# Patient Record
Sex: Female | Born: 1955 | Race: White | Hispanic: No | Marital: Married | Smoking: Never smoker
Health system: Southern US, Community
[De-identification: ages and names within clinical notes are randomized; demographics above are authoritative.]

## PROBLEM LIST (undated history)

## (undated) DIAGNOSIS — I499 Cardiac arrhythmia, unspecified: Secondary | ICD-10-CM

## (undated) DIAGNOSIS — M8580 Other specified disorders of bone density and structure, unspecified site: Secondary | ICD-10-CM

## (undated) DIAGNOSIS — C801 Malignant (primary) neoplasm, unspecified: Secondary | ICD-10-CM

## (undated) HISTORY — PX: POLYPECTOMY: SHX149

## (undated) HISTORY — DX: Other specified disorders of bone density and structure, unspecified site: M85.80

## (undated) HISTORY — DX: Malignant (primary) neoplasm, unspecified: C80.1

## (undated) HISTORY — PX: COLONOSCOPY: SHX174

---
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
## 2003-01-16 ENCOUNTER — Other Ambulatory Visit: Admission: RE | Admit: 2003-01-16 | Discharge: 2003-01-16 | Payer: Self-pay | Admitting: Gynecology

## 2003-07-06 HISTORY — PX: LUMBAR DISC SURGERY: SHX700

## 2004-07-23 ENCOUNTER — Ambulatory Visit (HOSPITAL_COMMUNITY): Admission: RE | Admit: 2004-07-23 | Discharge: 2004-07-24 | Payer: Self-pay | Admitting: Neurological Surgery

## 2005-01-25 ENCOUNTER — Other Ambulatory Visit: Admission: RE | Admit: 2005-01-25 | Discharge: 2005-01-25 | Payer: Self-pay | Admitting: Gynecology

## 2007-04-05 ENCOUNTER — Ambulatory Visit: Payer: Self-pay | Admitting: Gastroenterology

## 2007-04-12 ENCOUNTER — Ambulatory Visit: Payer: Self-pay | Admitting: Gastroenterology

## 2007-04-12 ENCOUNTER — Encounter (INDEPENDENT_AMBULATORY_CARE_PROVIDER_SITE_OTHER): Payer: Self-pay | Admitting: *Deleted

## 2007-04-12 ENCOUNTER — Encounter: Payer: Self-pay | Admitting: Gastroenterology

## 2009-03-26 ENCOUNTER — Other Ambulatory Visit: Admission: RE | Admit: 2009-03-26 | Discharge: 2009-03-26 | Payer: Self-pay | Admitting: Obstetrics and Gynecology

## 2009-03-26 ENCOUNTER — Encounter: Payer: Self-pay | Admitting: Women's Health

## 2009-03-26 ENCOUNTER — Ambulatory Visit: Payer: Self-pay | Admitting: Women's Health

## 2009-04-22 ENCOUNTER — Ambulatory Visit (HOSPITAL_COMMUNITY): Admission: RE | Admit: 2009-04-22 | Discharge: 2009-04-22 | Payer: Self-pay | Admitting: Gynecology

## 2009-04-25 ENCOUNTER — Ambulatory Visit: Payer: Self-pay | Admitting: Gastroenterology

## 2009-04-25 DIAGNOSIS — K3189 Other diseases of stomach and duodenum: Secondary | ICD-10-CM | POA: Insufficient documentation

## 2009-04-25 DIAGNOSIS — R1013 Epigastric pain: Secondary | ICD-10-CM

## 2009-04-28 LAB — CONVERTED CEMR LAB
ALT: 20 units/L (ref 0–35)
Albumin: 4.5 g/dL (ref 3.5–5.2)
Basophils Absolute: 0 10*3/uL (ref 0.0–0.1)
CO2: 28 meq/L (ref 19–32)
Calcium: 9.3 mg/dL (ref 8.4–10.5)
Chloride: 101 meq/L (ref 96–112)
Eosinophils Relative: 1.2 % (ref 0.0–5.0)
GFR calc non Af Amer: 79.92 mL/min (ref >60)
Glucose, Bld: 84 mg/dL (ref 70–99)
HCT: 36.7 % (ref 36.0–46.0)
Lymphocytes Relative: 32.8 % (ref 12.0–46.0)
Lymphs Abs: 1.5 10*3/uL (ref 0.7–4.0)
Monocytes Relative: 8.1 % (ref 3.0–12.0)
Neutrophils Relative %: 56.9 % (ref 43.0–77.0)
Platelets: 223 10*3/uL (ref 150.0–400.0)
Potassium: 3.9 meq/L (ref 3.5–5.1)
Sodium: 139 meq/L (ref 135–145)
TSH: 2.48 microintl units/mL (ref 0.35–5.50)
Total Protein: 7.4 g/dL (ref 6.0–8.3)
WBC: 4.7 10*3/uL (ref 4.5–10.5)

## 2009-05-01 ENCOUNTER — Ambulatory Visit: Payer: Self-pay | Admitting: Cardiovascular Disease

## 2009-05-06 ENCOUNTER — Telehealth: Payer: Self-pay | Admitting: Gastroenterology

## 2009-05-06 ENCOUNTER — Encounter: Payer: Self-pay | Admitting: Gastroenterology

## 2010-02-25 ENCOUNTER — Encounter: Admission: RE | Admit: 2010-02-25 | Discharge: 2010-02-25 | Payer: Self-pay | Admitting: Family Medicine

## 2010-05-06 ENCOUNTER — Ambulatory Visit (HOSPITAL_COMMUNITY): Admission: RE | Admit: 2010-05-06 | Discharge: 2010-05-06 | Payer: Self-pay | Admitting: Family Medicine

## 2010-05-06 ENCOUNTER — Ambulatory Visit: Payer: Self-pay | Admitting: Women's Health

## 2010-05-06 ENCOUNTER — Other Ambulatory Visit: Admission: RE | Admit: 2010-05-06 | Discharge: 2010-05-06 | Payer: Self-pay | Admitting: Obstetrics and Gynecology

## 2010-05-27 ENCOUNTER — Ambulatory Visit: Payer: Self-pay | Admitting: Women's Health

## 2010-07-25 ENCOUNTER — Encounter: Payer: Self-pay | Admitting: Family Medicine

## 2010-08-18 ENCOUNTER — Telehealth: Payer: Self-pay | Admitting: Gastroenterology

## 2010-08-26 NOTE — Progress Notes (Signed)
Summary: ? reschedule   Phone Note Call from Patient Call back at Work Phone [phone]   Caller: Patient Call For: Dr Christella Hartigan Reason for Call: Talk to Nurse Summary of Call: Patient wants to rescheduled an EGD that was scheduled in '10 at the time patient was having problems (Dyspepsia) does she need to see Dr Christella Hartigan in the office again or can she reschedule appt. Initial call taken by: Tawni Levy,  August 18, 2010 2:25 PM  Follow-up for Phone Call        Dr Christella Hartigan do you want to see the pt in the office first? Follow-up by: Chales Abrahams CMA Duncan Dull),  August 18, 2010 2:28 PM  Additional Follow-up for Phone Call Additional follow up Details #1::        needs ROV before scheduling any appt. Additional Follow-up by: Rachael Fee MD,  August 18, 2010 2:41 PM    Additional Follow-up for Phone Call Additional follow up Details #2::    pt aware rov scheduled Follow-up by: Chales Abrahams CMA Duncan Dull),  August 18, 2010 2:43 PM

## 2010-09-18 ENCOUNTER — Ambulatory Visit: Payer: Self-pay | Admitting: Gastroenterology

## 2010-11-20 NOTE — Op Note (Signed)
Kayla Jacobs, Kayla Jacobs                 ACCOUNT NO.:  [id number]   MEDICAL RECORD NO.:  [id number]          PATIENT TYPE:  OIB   LOCATION:  2855                         FACILITY:  [facility]   PHYSICIAN:  Stefani Dama, M.D.  DATE OF BIRTH:  Nov 25, 1955   DATE OF PROCEDURE:  07/23/2004  DATE OF DISCHARGE:                                 OPERATIVE REPORT   PREOPERATIVE DIAGNOSIS:  Herniated nucleus pulposus, L4-L5 left with left  lumbar radiculopathy.   POSTOPERATIVE DIAGNOSIS:  Herniated nucleus pulposus, L4-L5 left with left  lumbar radiculopathy.   PROCEDURE:  L4-L5 METRx microdiskectomy with operating microscope,  microdissection technique.   SURGEON:  Stefani Dama, M.D.   FIRST ASSISTANT:  Hilda Lias, M.D.   ANESTHESIA:  General endotracheal.   INDICATIONS:  [name] is a 55 year old individual who has had  significant back and left lower extremity pain. She is been treated in  excess of six months conservatively and had some limited relief initially  but has had persistent lumbar radiculopathy, aggravated by any sort of  activity.  After careful consideration of options, she has been advised  regarding surgical diskectomy at the L4-L5 level.   PROCEDURE:  The patient was brought to the operating room supine on the  stretcher. After smooth induction of general endotracheal anesthesia, she  was turned prone and the back was shaved, prepped with Hibiclens and then  alcohol and then draped in sterile fashion.  Using fluoroscopic guidance  then the left L4-L5 interspace was localized. A K-wire was passed to the  laminar arch of L4 and then incision was made over this area after  infiltrating the skin with 0.5% Marcaine mixed with 1% lidocaine with  epinephrine for a total volume of 7 cc. Once the needle was placed then, a  series of dilators was placed over the K-wire and this was dilated up to the  14 mm diameter.  A 14 mm x 4 cm deep endoscopic cannula was then fixed  to  the operating table with a clamp.  The inner cannulas were removed and the  area was then evaluated under the operating microscope.  The soft tissues on  the laminar arch of L4 and the interlaminar space were then divided with the  monopolar cautery.  A combination of curettes and rongeurs was then used to  remove some of the ligamentous material.  A high-speed air drill and a 3 mm  dissecting tool was used to remove the inferior margin lamina of L4 out to  the mesial wall of the facet.  The interlaminar space was then cleared and  the common dural tube was identified.  On the lateral aspect of the common  dural tube a laminotomy was carried out further creating a partial  facetectomy at L4-L5.  The common dural tube could then be retracted gently  medially.  Underlying this was noted to be a significant soft tissue mass  covered by some ligamentous material. This was incised in a vertical fashion  and underneath was identified several large fragments of disk material.  With these being  removed, the common dural tube and particularly the take  off of the L5 nerve root was well decompressed. Further palpation around  this area identified the disk material had come from an opening in the disk  space itself. This was directly entered by cutting back the ligament  somewhat with a 2 mm Kerrison punch. The disk space could be entered easily  and a combination of curettes and rongeurs was then used to evacuate disk  space of a significant quantity of markedly degenerated disk material.  The  space was irrigated copiously and it was rongeured and curetted the number  of times to release any loose disk fragments within the disk space that  might need to come out. Once this area was decompressed, hemostasis in the  soft tissues and particularly epidural veins was obtained with bipolar  cautery. Care was taken to make sure that the L5 nerve root and the common  dural tube was well decompressed.   No spinal fluid leakage was identified  during this procedure.  The microscope was then removed.  The endoscopic  cannula was then removed and then the lumbodorsal fascia was closed with 3-0  Vicryl in interrupted fashion, 3-0 Vicryl used in the subcuticular tissues.  Dermabond was placed on the skin. Blood loss for this procedure was  estimated at 20 cc.      HJE/MEDQ  D:  07/23/2004  T:  07/23/2004  Job:  [id number]

## 2011-07-18 ENCOUNTER — Other Ambulatory Visit: Payer: Self-pay | Admitting: Women's Health

## 2012-04-14 ENCOUNTER — Encounter: Payer: Self-pay | Admitting: Gastroenterology

## 2012-07-13 ENCOUNTER — Encounter: Payer: Self-pay | Admitting: Gastroenterology

## 2012-07-27 ENCOUNTER — Ambulatory Visit (AMBULATORY_SURGERY_CENTER): Payer: BC Managed Care – PPO | Admitting: *Deleted

## 2012-07-27 VITALS — Ht 66.0 in | Wt 110.4 lb

## 2012-07-27 DIAGNOSIS — Z1211 Encounter for screening for malignant neoplasm of colon: Secondary | ICD-10-CM

## 2012-07-27 MED ORDER — MOVIPREP 100 G PO SOLR: ORAL | Status: DC

## 2012-08-09 ENCOUNTER — Encounter: Payer: Self-pay | Admitting: Gastroenterology

## 2012-08-09 ENCOUNTER — Ambulatory Visit (AMBULATORY_SURGERY_CENTER): Payer: BC Managed Care – PPO | Admitting: Gastroenterology

## 2012-08-09 VITALS — BP 97/63 | HR 70 | Temp 98.0°F | Resp 26 | Ht 66.0 in | Wt 110.0 lb

## 2012-08-09 DIAGNOSIS — D126 Benign neoplasm of colon, unspecified: Secondary | ICD-10-CM

## 2012-08-09 DIAGNOSIS — Z1211 Encounter for screening for malignant neoplasm of colon: Secondary | ICD-10-CM

## 2012-08-09 DIAGNOSIS — Z86010 Personal history of colonic polyps: Secondary | ICD-10-CM

## 2012-08-09 MED ORDER — SODIUM CHLORIDE 0.9 % IV SOLN: 500.0000 mL | INTRAVENOUS | Status: DC

## 2012-08-09 NOTE — Patient Instructions (Addendum)

## 2012-08-09 NOTE — Op Note (Signed)
[facility] [address] [place], [address]   COLONOSCOPY PROCEDURE REPORT  PATIENT: Kayla Jacobs, Kayla Jacobs  MR#: [id number] BIRTHDATE: 25-Jul-1955 , 56  yrs. old GENDER: Female ENDOSCOPIST: Rachael Fee, MD PROCEDURE DATE:  08/09/2012 PROCEDURE:   Colonoscopy with snare polypectomy ASA CLASS:   Class II INDICATIONS: 3mm adenoma removed 2008. MEDICATIONS: Fentanyl 75 mcg IV, Versed 7 mg IV, and These medications were titrated to patient response per physician's verbal order  DESCRIPTION OF PROCEDURE:   After the risks benefits and alternatives of the procedure were thoroughly explained, informed consent was obtained.  A digital rectal exam revealed no abnormalities of the rectum.   The LB CF-H180AL [id number]  endoscope was introduced through the anus and advanced to the cecum, which was identified by both the appendix and ileocecal valve. No adverse events experienced.   The quality of the prep was good, using MoviPrep  The instrument was then slowly withdrawn as the colon was fully examined.  COLON FINDINGS: One small polyp was removed and sent to pathology. This was 3mm across, sessile, located in transverse colon, removed with cold snare, sent to pathology (jar 1).  The examination was otherwise normal.  Retroflexed views revealed no abnormalities. The time to cecum=6 minutes 30 seconds.  Withdrawal time=9 minutes 28 seconds.  The scope was withdrawn and the procedure completed. COMPLICATIONS: There were no complications.  ENDOSCOPIC IMPRESSION: One small polyp was removed and sent to pathology. The examination was otherwise normal.  RECOMMENDATIONS: If the polyp(s) removed today are proven to be adenomatous (pre-cancerous) polyps, you will need a repeat colonoscopy in 5 years.  Otherwise you should continue to follow colorectal cancer screening guidelines for "routine risk" patients with colonoscopy in 10 years.  You will receive a letter within 1-2 weeks  with the results of your biopsy as well as final recommendations.  Please call my office if you have not received a letter after 3 weeks.   eSigned:  Rachael Fee, MD 08/09/2012 10:25 AM   cc: Herb Grays, MD

## 2012-08-09 NOTE — Progress Notes (Signed)
Pt. Has not passed gas post procedure.  She is not distended and denies any discomfort.  Dr. Christella Hartigan advised.  No orders given.

## 2012-08-10 ENCOUNTER — Telehealth: Payer: Self-pay | Admitting: *Deleted

## 2012-08-10 NOTE — Telephone Encounter (Signed)
  Follow up Call-  Call back number 08/09/2012  Post procedure Call Back phone  # [phone]  Permission to leave phone message Yes     No answer, left message to call office if questions or concerns.

## 2012-08-16 ENCOUNTER — Encounter: Payer: Self-pay | Admitting: Gastroenterology

## 2014-09-13 ENCOUNTER — Other Ambulatory Visit: Payer: Self-pay | Admitting: Family Medicine

## 2014-09-13 ENCOUNTER — Other Ambulatory Visit (HOSPITAL_COMMUNITY)
Admission: RE | Admit: 2014-09-13 | Discharge: 2014-09-13 | Disposition: A | Discharge status: Home / Self Care | Payer: BLUE CROSS/BLUE SHIELD | Source: Ambulatory Visit | Attending: Family Medicine | Admitting: Family Medicine

## 2014-09-13 DIAGNOSIS — Z1151 Encounter for screening for human papillomavirus (HPV): Secondary | ICD-10-CM | POA: Insufficient documentation

## 2014-09-13 DIAGNOSIS — Z124 Encounter for screening for malignant neoplasm of cervix: Secondary | ICD-10-CM | POA: Insufficient documentation

## 2014-09-17 LAB — CYTOLOGY - PAP

## 2014-10-28 ENCOUNTER — Other Ambulatory Visit: Payer: Self-pay | Admitting: Family Medicine

## 2014-10-28 DIAGNOSIS — R9389 Abnormal findings on diagnostic imaging of other specified body structures: Secondary | ICD-10-CM

## 2015-03-25 ENCOUNTER — Encounter: Payer: Self-pay | Admitting: Gastroenterology

## 2016-01-01 DIAGNOSIS — H11042 Peripheral pterygium, stationary, left eye: Secondary | ICD-10-CM | POA: Diagnosis not present

## 2016-01-01 DIAGNOSIS — H2513 Age-related nuclear cataract, bilateral: Secondary | ICD-10-CM | POA: Diagnosis not present

## 2016-02-16 DIAGNOSIS — M7989 Other specified soft tissue disorders: Secondary | ICD-10-CM | POA: Diagnosis not present

## 2016-04-27 DIAGNOSIS — C44712 Basal cell carcinoma of skin of right lower limb, including hip: Secondary | ICD-10-CM | POA: Diagnosis not present

## 2016-04-27 DIAGNOSIS — Z85828 Personal history of other malignant neoplasm of skin: Secondary | ICD-10-CM | POA: Diagnosis not present

## 2016-04-27 DIAGNOSIS — Z08 Encounter for follow-up examination after completed treatment for malignant neoplasm: Secondary | ICD-10-CM | POA: Diagnosis not present

## 2016-04-27 DIAGNOSIS — D485 Neoplasm of uncertain behavior of skin: Secondary | ICD-10-CM | POA: Diagnosis not present

## 2016-09-18 DIAGNOSIS — J019 Acute sinusitis, unspecified: Secondary | ICD-10-CM | POA: Diagnosis not present

## 2016-11-08 DIAGNOSIS — J209 Acute bronchitis, unspecified: Secondary | ICD-10-CM | POA: Diagnosis not present

## 2017-01-06 DIAGNOSIS — H524 Presbyopia: Secondary | ICD-10-CM | POA: Diagnosis not present

## 2017-01-06 DIAGNOSIS — H5203 Hypermetropia, bilateral: Secondary | ICD-10-CM | POA: Diagnosis not present

## 2017-01-11 DIAGNOSIS — Z1231 Encounter for screening mammogram for malignant neoplasm of breast: Secondary | ICD-10-CM | POA: Diagnosis not present

## 2017-01-11 DIAGNOSIS — M8589 Other specified disorders of bone density and structure, multiple sites: Secondary | ICD-10-CM | POA: Diagnosis not present

## 2017-02-08 DIAGNOSIS — R1013 Epigastric pain: Secondary | ICD-10-CM | POA: Diagnosis not present

## 2017-02-08 DIAGNOSIS — M8580 Other specified disorders of bone density and structure, unspecified site: Secondary | ICD-10-CM | POA: Diagnosis not present

## 2017-02-08 DIAGNOSIS — Z0000 Encounter for general adult medical examination without abnormal findings: Secondary | ICD-10-CM | POA: Diagnosis not present

## 2017-02-09 ENCOUNTER — Other Ambulatory Visit: Payer: Self-pay | Admitting: Family Medicine

## 2017-02-09 DIAGNOSIS — R1013 Epigastric pain: Secondary | ICD-10-CM

## 2017-03-03 ENCOUNTER — Ambulatory Visit
Admission: RE | Admit: 2017-03-03 | Discharge: 2017-03-03 | Disposition: A | Discharge status: Home / Self Care | Payer: BLUE CROSS/BLUE SHIELD | Source: Ambulatory Visit | Attending: Family Medicine | Admitting: Family Medicine

## 2017-03-03 DIAGNOSIS — R1013 Epigastric pain: Secondary | ICD-10-CM

## 2017-03-03 DIAGNOSIS — K7689 Other specified diseases of liver: Secondary | ICD-10-CM | POA: Diagnosis not present

## 2017-03-03 MED ORDER — IOPAMIDOL (ISOVUE-300) INJECTION 61%
100.0000 mL | Freq: Once | INTRAVENOUS | Status: AC | PRN
  Administered 2017-03-03: 100 mL via INTRAVENOUS

## 2017-03-10 ENCOUNTER — Other Ambulatory Visit: Payer: Self-pay | Admitting: Family Medicine

## 2017-03-10 DIAGNOSIS — R160 Hepatomegaly, not elsewhere classified: Secondary | ICD-10-CM

## 2017-03-20 ENCOUNTER — Other Ambulatory Visit: Payer: BLUE CROSS/BLUE SHIELD

## 2017-03-31 ENCOUNTER — Ambulatory Visit
Admission: RE | Admit: 2017-03-31 | Discharge: 2017-03-31 | Disposition: A | Discharge status: Home / Self Care | Payer: BLUE CROSS/BLUE SHIELD | Source: Ambulatory Visit | Attending: Family Medicine | Admitting: Family Medicine

## 2017-03-31 DIAGNOSIS — D1803 Hemangioma of intra-abdominal structures: Secondary | ICD-10-CM | POA: Diagnosis not present

## 2017-03-31 DIAGNOSIS — R160 Hepatomegaly, not elsewhere classified: Secondary | ICD-10-CM

## 2017-03-31 MED ORDER — GADOBENATE DIMEGLUMINE 529 MG/ML IV SOLN
10.0000 mL | Freq: Once | INTRAVENOUS | Status: AC | PRN
  Administered 2017-03-31: 10 mL via INTRAVENOUS

## 2017-04-26 DIAGNOSIS — Z08 Encounter for follow-up examination after completed treatment for malignant neoplasm: Secondary | ICD-10-CM | POA: Diagnosis not present

## 2017-04-26 DIAGNOSIS — Z85828 Personal history of other malignant neoplasm of skin: Secondary | ICD-10-CM | POA: Diagnosis not present

## 2017-08-09 ENCOUNTER — Encounter: Payer: Self-pay | Admitting: Gastroenterology

## 2017-08-11 DIAGNOSIS — J069 Acute upper respiratory infection, unspecified: Secondary | ICD-10-CM | POA: Diagnosis not present

## 2017-08-11 DIAGNOSIS — R0981 Nasal congestion: Secondary | ICD-10-CM | POA: Diagnosis not present

## 2017-08-19 ENCOUNTER — Encounter: Payer: Self-pay | Admitting: Gastroenterology

## 2017-09-27 DIAGNOSIS — Z23 Encounter for immunization: Secondary | ICD-10-CM | POA: Diagnosis not present

## 2017-10-11 ENCOUNTER — Encounter: Payer: BLUE CROSS/BLUE SHIELD | Admitting: Gastroenterology

## 2017-10-25 ENCOUNTER — Encounter: Payer: Self-pay | Admitting: Gastroenterology

## 2017-10-25 ENCOUNTER — Ambulatory Visit (AMBULATORY_SURGERY_CENTER): Payer: Self-pay | Admitting: *Deleted

## 2017-10-25 ENCOUNTER — Other Ambulatory Visit: Payer: Self-pay

## 2017-10-25 VITALS — Ht 65.56 in | Wt 107.0 lb

## 2017-10-25 DIAGNOSIS — Z860100 Personal history of colon polyps, unspecified: Secondary | ICD-10-CM

## 2017-10-25 MED ORDER — PEG 3350-KCL-NA BICARB-NACL 420 G PO SOLR: 4000.0000 mL | Freq: Once | ORAL | 0 refills | Status: AC

## 2017-10-25 NOTE — Progress Notes (Signed)
No egg or soy allergy known to patient  No issues with past sedation with any surgeries  or procedures, no intubation problems  No diet pills per patient No home 02 use per patient  No blood thinners per patient  Pt denies issues with constipation  No A fib or A flutter  EMMI video sent to pt's e mail pt declined   

## 2017-11-08 ENCOUNTER — Other Ambulatory Visit: Payer: Self-pay

## 2017-11-08 ENCOUNTER — Encounter: Payer: Self-pay | Admitting: Gastroenterology

## 2017-11-08 ENCOUNTER — Ambulatory Visit (AMBULATORY_SURGERY_CENTER): Payer: BLUE CROSS/BLUE SHIELD | Admitting: Gastroenterology

## 2017-11-08 VITALS — BP 110/57 | HR 70 | Temp 98.2°F | Resp 16 | Ht 66.0 in | Wt 108.0 lb

## 2017-11-08 DIAGNOSIS — Z86010 Personal history of colonic polyps: Secondary | ICD-10-CM

## 2017-11-08 DIAGNOSIS — Z1211 Encounter for screening for malignant neoplasm of colon: Secondary | ICD-10-CM | POA: Diagnosis not present

## 2017-11-08 MED ORDER — SODIUM CHLORIDE 0.9 % IV SOLN: 500.0000 mL | Freq: Once | INTRAVENOUS | Status: DC

## 2017-11-08 NOTE — Progress Notes (Signed)
To PACU, VSS. Report to RN.tb 

## 2017-11-08 NOTE — Progress Notes (Signed)
Pt's states no medical or surgical changes since previsit or office visit. 

## 2017-11-08 NOTE — Patient Instructions (Signed)

## 2017-11-08 NOTE — Op Note (Signed)
[place] [facility] Patient Name: Kayla Jacobs Procedure Date: 11/08/2017 1:49 PM MRN: [id number] Endoscopist: Milus Banister , MD Age: 62 Referring MD:  Date of Birth: 08/15/1955 Gender: Female Account #: [id number] Procedure:                Colonoscopy Indications:              High risk colon cancer surveillance: Personal                            history of colonic polyps; colonoscopy 2008 single                            subCM adenoma, colonoscopy 2014 single subCM adenoma Medicines:                Monitored Anesthesia Care Procedure:                Pre-Anesthesia Assessment:                           - Prior to the procedure, a History and Physical                            was performed, and patient medications and                            allergies were reviewed. The patient's tolerance of                            previous anesthesia was also reviewed. The risks                            and benefits of the procedure and the sedation                            options and risks were discussed with the patient.                            All questions were answered, and informed consent                            was obtained. Prior Anticoagulants: The patient has                            taken no previous anticoagulant or antiplatelet                            agents. ASA Grade Assessment: II - A patient with                            mild systemic disease. After reviewing the risks                            and benefits, the patient was deemed in  satisfactory condition to undergo the procedure.                           After obtaining informed consent, the colonoscope                            was passed under direct vision. Throughout the                            procedure, the patient's blood pressure, pulse, and                            oxygen saturations were monitored continuously. The                            Colonoscope  was introduced through the anus and                            advanced to the the cecum, identified by                            appendiceal orifice and ileocecal valve. The                            colonoscopy was performed without difficulty. The                            patient tolerated the procedure well. The quality                            of the bowel preparation was good. The ileocecal                            valve, appendiceal orifice, and rectum were                            photographed. Scope In: 1:57:34 PM Scope Out: 2:14:42 PM Scope Withdrawal Time: 0 hours 6 minutes 49 seconds  Total Procedure Duration: 0 hours 17 minutes 8 seconds  Findings:                 The entire examined colon appeared normal on direct                            and retroflexion views. Complications:            No immediate complications. Estimated blood loss:                            None. Estimated Blood Loss:     Estimated blood loss: none. Impression:               - The entire examined colon is normal on direct and                            retroflexion views.                           -  No polyps or cancers. Recommendation:           - Patient has a contact number available for                            emergencies. The signs and symptoms of potential                            delayed complications were discussed with the                            patient. Return to normal activities tomorrow.                            Written discharge instructions were provided to the                            patient.                           - Resume previous diet.                           - Continue present medications.                           - Repeat colonoscopy in 10 years for screening                            purposes. Milus Banister, MD 11/08/2017 2:17:19 PM This report has been signed electronically.

## 2017-11-09 ENCOUNTER — Telehealth: Payer: Self-pay | Admitting: *Deleted

## 2017-11-09 ENCOUNTER — Telehealth: Payer: Self-pay

## 2017-11-09 NOTE — Telephone Encounter (Signed)
Left message

## 2017-11-09 NOTE — Telephone Encounter (Signed)
Left message on voicemail.

## 2018-01-17 DIAGNOSIS — H524 Presbyopia: Secondary | ICD-10-CM | POA: Diagnosis not present

## 2018-01-18 DIAGNOSIS — C44712 Basal cell carcinoma of skin of right lower limb, including hip: Secondary | ICD-10-CM | POA: Diagnosis not present

## 2018-01-18 DIAGNOSIS — D485 Neoplasm of uncertain behavior of skin: Secondary | ICD-10-CM | POA: Diagnosis not present

## 2018-02-09 DIAGNOSIS — Z1231 Encounter for screening mammogram for malignant neoplasm of breast: Secondary | ICD-10-CM | POA: Diagnosis not present

## 2018-03-20 DIAGNOSIS — C44712 Basal cell carcinoma of skin of right lower limb, including hip: Secondary | ICD-10-CM | POA: Diagnosis not present

## 2018-04-15 DIAGNOSIS — J019 Acute sinusitis, unspecified: Secondary | ICD-10-CM | POA: Diagnosis not present

## 2018-06-09 DIAGNOSIS — J0100 Acute maxillary sinusitis, unspecified: Secondary | ICD-10-CM | POA: Diagnosis not present

## 2018-06-12 DIAGNOSIS — J019 Acute sinusitis, unspecified: Secondary | ICD-10-CM | POA: Diagnosis not present

## 2018-08-07 DIAGNOSIS — C44712 Basal cell carcinoma of skin of right lower limb, including hip: Secondary | ICD-10-CM | POA: Diagnosis not present

## 2018-08-25 ENCOUNTER — Observation Stay (HOSPITAL_COMMUNITY)
Admission: EM | Admit: 2018-08-25 | Discharge: 2018-08-26 | Disposition: A | Discharge status: Home / Self Care | Payer: BC Managed Care – PPO | Attending: Interventional Cardiology | Admitting: Interventional Cardiology

## 2018-08-25 ENCOUNTER — Encounter (HOSPITAL_COMMUNITY): Payer: Self-pay | Admitting: Emergency Medicine

## 2018-08-25 ENCOUNTER — Other Ambulatory Visit: Payer: Self-pay

## 2018-08-25 ENCOUNTER — Emergency Department (HOSPITAL_COMMUNITY): Payer: BC Managed Care – PPO

## 2018-08-25 DIAGNOSIS — Z85828 Personal history of other malignant neoplasm of skin: Secondary | ICD-10-CM | POA: Insufficient documentation

## 2018-08-25 DIAGNOSIS — Z7901 Long term (current) use of anticoagulants: Secondary | ICD-10-CM | POA: Insufficient documentation

## 2018-08-25 DIAGNOSIS — I4510 Unspecified right bundle-branch block: Secondary | ICD-10-CM | POA: Insufficient documentation

## 2018-08-25 DIAGNOSIS — I071 Rheumatic tricuspid insufficiency: Secondary | ICD-10-CM | POA: Insufficient documentation

## 2018-08-25 DIAGNOSIS — R002 Palpitations: Secondary | ICD-10-CM | POA: Diagnosis not present

## 2018-08-25 DIAGNOSIS — I4892 Unspecified atrial flutter: Secondary | ICD-10-CM | POA: Diagnosis present

## 2018-08-25 DIAGNOSIS — I341 Nonrheumatic mitral (valve) prolapse: Secondary | ICD-10-CM | POA: Insufficient documentation

## 2018-08-25 DIAGNOSIS — Z79899 Other long term (current) drug therapy: Secondary | ICD-10-CM | POA: Diagnosis not present

## 2018-08-25 DIAGNOSIS — I700 Atherosclerosis of aorta: Secondary | ICD-10-CM | POA: Insufficient documentation

## 2018-08-25 DIAGNOSIS — I483 Typical atrial flutter: Secondary | ICD-10-CM | POA: Diagnosis not present

## 2018-08-25 DIAGNOSIS — M8580 Other specified disorders of bone density and structure, unspecified site: Secondary | ICD-10-CM | POA: Insufficient documentation

## 2018-08-25 LAB — COMPREHENSIVE METABOLIC PANEL
ALBUMIN: 4.4 g/dL (ref 3.5–5.0)
ALT: 24 U/L (ref 0–44)
AST: 33 U/L (ref 15–41)
Alkaline Phosphatase: 37 U/L — ABNORMAL LOW (ref 38–126)
Anion gap: 10 (ref 5–15)
BUN: 12 mg/dL (ref 8–23)
CHLORIDE: 106 mmol/L (ref 98–111)
CO2: 24 mmol/L (ref 22–32)
CREATININE: 1.07 mg/dL — AB (ref 0.44–1.00)
Calcium: 9.9 mg/dL (ref 8.9–10.3)
GFR calc Af Amer: >60 mL/min (ref >60)
GFR calc non Af Amer: 56 mL/min — ABNORMAL LOW (ref >60)
Glucose, Bld: 98 mg/dL (ref 70–99)
POTASSIUM: 4 mmol/L (ref 3.5–5.1)
SODIUM: 140 mmol/L (ref 135–145)
Total Bilirubin: 1.3 mg/dL — ABNORMAL HIGH (ref 0.3–1.2)
Total Protein: 6.8 g/dL (ref 6.5–8.1)

## 2018-08-25 LAB — CBC WITH DIFFERENTIAL/PLATELET
ABS IMMATURE GRANULOCYTES: 0.01 10*3/uL (ref 0.00–0.07)
BASOS ABS: 0 10*3/uL (ref 0.0–0.1)
BASOS PCT: 1 %
Eosinophils Absolute: 0 10*3/uL (ref 0.0–0.5)
Eosinophils Relative: 0 %
HCT: 42.5 % (ref 36.0–46.0)
Hemoglobin: 13.7 g/dL (ref 12.0–15.0)
IMMATURE GRANULOCYTES: 0 %
Lymphocytes Relative: 28 %
Lymphs Abs: 1.3 10*3/uL (ref 0.7–4.0)
MCH: 32 pg (ref 26.0–34.0)
MCHC: 32.2 g/dL (ref 30.0–36.0)
MCV: 99.3 fL (ref 80.0–100.0)
MONOS PCT: 11 %
Monocytes Absolute: 0.5 10*3/uL (ref 0.1–1.0)
NEUTROS ABS: 2.7 10*3/uL (ref 1.7–7.7)
NEUTROS PCT: 60 %
NRBC: 0 % (ref 0.0–0.2)
PLATELETS: 177 10*3/uL (ref 150–400)
RBC: 4.28 MIL/uL (ref 3.87–5.11)
RDW: 11.9 % (ref 11.5–15.5)
WBC: 4.6 10*3/uL (ref 4.0–10.5)

## 2018-08-25 LAB — TROPONIN I

## 2018-08-25 LAB — TSH: TSH: 4.332 u[IU]/mL (ref 0.350–4.500)

## 2018-08-25 LAB — MAGNESIUM: Magnesium: 1.7 mg/dL (ref 1.7–2.4)

## 2018-08-25 MED ORDER — DILTIAZEM HCL-DEXTROSE 100-5 MG/100ML-% IV SOLN (PREMIX)
5.0000 mg/h | INTRAVENOUS | Status: DC
  Administered 2018-08-25: 5 mg/h via INTRAVENOUS
  Filled 2018-08-25: qty 100

## 2018-08-25 MED ORDER — ONDANSETRON HCL 4 MG/2ML IJ SOLN: 4.0000 mg | Freq: Four times a day (QID) | INTRAMUSCULAR | Status: DC | PRN

## 2018-08-25 MED ORDER — DILTIAZEM HCL 25 MG/5ML IV SOLN
15.0000 mg | Freq: Once | INTRAVENOUS | Status: AC
  Administered 2018-08-25: 15 mg via INTRAVENOUS
  Filled 2018-08-25: qty 5

## 2018-08-25 MED ORDER — DILTIAZEM HCL 25 MG/5ML IV SOLN
20.0000 mg | Freq: Once | INTRAVENOUS | Status: AC
  Administered 2018-08-25: 20 mg via INTRAVENOUS
  Filled 2018-08-25: qty 5

## 2018-08-25 MED ORDER — ACETAMINOPHEN 325 MG PO TABS: 650.0000 mg | ORAL_TABLET | ORAL | Status: DC | PRN

## 2018-08-25 MED ORDER — DILTIAZEM HCL 30 MG PO TABS
30.0000 mg | ORAL_TABLET | Freq: Four times a day (QID) | ORAL | Status: DC
  Administered 2018-08-25 – 2018-08-26 (×3): 30 mg via ORAL
  Filled 2018-08-25 (×3): qty 1

## 2018-08-25 MED ORDER — APIXABAN 5 MG PO TABS
5.0000 mg | ORAL_TABLET | Freq: Two times a day (BID) | ORAL | Status: DC
  Administered 2018-08-25 – 2018-08-26 (×2): 5 mg via ORAL
  Filled 2018-08-25 (×2): qty 1

## 2018-08-25 NOTE — ED Provider Notes (Signed)
[facility] EMERGENCY DEPARTMENT Provider Note   CSN: [id number] Arrival date & time: 08/25/18  1416    History   Chief Complaint No chief complaint on file.   HPI Kayla Jacobs is a 63 y.o. female.     HPI 63 year old female presents the emergency department with complaints of palpitations since Sunday.  No prior history of A. fib or a flutter.  When saw her primary care doctor today was found to have atrial flutter pleuritic.  She is continued to be able to work out through the week.  No new medications.  1 cup of coffee a day.  No other dietary supplements.  No other complaints at this time.  No chest pain or chest tightness.  No syncope.   Past Medical History:  Diagnosis Date  . Cancer ([place])    basal cell skin cancer  . Osteopenia     Patient Active Problem List   Diagnosis Date Noted  . DYSPEPSIA&OTHER [facility] DISORDERS FUNCTION STOMACH 04/25/2009    Past Surgical History:  Procedure Laterality Date  . CESAREAN SECTION  1988  . COLONOSCOPY    . [place] SURGERY  2005  . POLYPECTOMY    . TONSILLECTOMY  1962     OB History   No obstetric history on file.      Home Medications    Prior to Admission medications   Medication Sig Start Date End Date Taking? Authorizing Provider  Cholecalciferol (VITAMIN D3) 1000 units CHEW Chew 2,000 Units by mouth daily. gummies    [provider]  Multiple Vitamin (MULTIVITAMIN) tablet Take 1 tablet by mouth daily. Vita fusion Gummies for Women    [provider]  vitamin C (ASCORBIC ACID) 500 MG tablet Take 1,000 mg by mouth daily.    [provider]    Family History Family History  Problem Relation Age of Onset  . Esophageal cancer Father   . Colon cancer Neg Hx   . Stomach cancer Neg Hx   . Colon polyps Neg Hx   . Rectal cancer Neg Hx     Social History Social History   Tobacco Use  . Smoking status: Never Smoker  . Smokeless tobacco: Never Used  Substance Use  Topics  . Alcohol use: Yes    Alcohol/week: 7.0 standard drinks    Types: 7 Glasses of wine per week  . Drug use: No     Allergies   Patient has no known allergies.   Review of Systems Review of Systems  All other systems reviewed and are negative.    Physical Exam Updated Vital Signs BP 106/65   Pulse (!) 45   Resp 13   Ht 5\' 6"  (1.676 m)   Wt 49.9 kg   SpO2 100%   BMI 17.75 kg/m   Physical Exam Vitals signs and nursing note reviewed.  Constitutional:      General: She is not in acute distress.    Appearance: She is well-developed.  HENT:     Head: Normocephalic and atraumatic.  Neck:     Musculoskeletal: Normal range of motion.  Cardiovascular:     Rate and Rhythm: Tachycardia present.     Heart sounds: Normal heart sounds.  Pulmonary:     Effort: Pulmonary effort is normal.     Breath sounds: Normal breath sounds.  Abdominal:     General: There is no distension.     Palpations: Abdomen is soft.     Tenderness: There is  no abdominal tenderness.  Musculoskeletal: Normal range of motion.  Skin:    General: Skin is warm and dry.  Neurological:     Mental Status: She is alert and oriented to person, place, and time.  Psychiatric:        Judgment: Judgment normal.      ED Treatments / Results  Labs (all labs ordered are listed, but only abnormal results are displayed) Labs Reviewed  COMPREHENSIVE METABOLIC PANEL - Abnormal; Notable for the following components:      Result Value   Creatinine, Ser 1.07 (*)    Alkaline Phosphatase 37 (*)    Total Bilirubin 1.3 (*)    GFR calc non Af Amer 56 (*)    All other components within normal limits  CBC WITH DIFFERENTIAL/PLATELET  TROPONIN I  TSH  MAGNESIUM    EKG EKG Interpretation  Date/Time:  Friday August 25 2018 14:35:20 EST Ventricular Rate:  143 PR Interval:    QRS Duration: 144 QT Interval:  322 QTC Calculation: 497 R Axis:   107 Text Interpretation:  Atrial flutter with predominant 2:1  AV block Right bundle branch block Probable lateral infarct, old No old tracing to compare Confirmed by Jola Schmidt [phone]) on 08/25/2018 4:03:31 PM   Radiology Dg Chest Portable 1 View  Result Date: 08/25/2018 CLINICAL DATA:  Chest palpitations today. EXAM: PORTABLE CHEST 1 VIEW COMPARISON:  None. FINDINGS: The lungs are clear. Heart size is normal. Aortic atherosclerosis is noted. No pneumothorax or pleural effusion. No acute or focal bony abnormality IMPRESSION: No acute disease. Atherosclerosis. Electronically Signed   By: Inge Rise M.D.   On: 08/25/2018 15:05    Procedures Procedures (including critical care time)  Medications Ordered in ED Medications  diltiazem (CARDIZEM) 100 mg in dextrose 5% 144mL (1 mg/mL) infusion (5 mg/hr Intravenous New Bag/Given 08/25/18 1512)  diltiazem (CARDIZEM) injection 15 mg (has no administration in time range)  diltiazem (CARDIZEM) injection 20 mg (20 mg Intravenous Given 08/25/18 1509)     Initial Impression / Assessment and Plan / ED Course  I have reviewed the triage vital signs and the nursing notes.  Pertinent labs & imaging results that were available during my care of the patient were reviewed by me and considered in my medical decision making (see chart for details).        Symptomatic atrial flutter.  Attempting rate control here with Cardizem.  Cardiology consultation.    Final Clinical Impressions(s) / ED Diagnoses   Final diagnoses:  Typical atrial flutter [facility])    ED Discharge Orders    None       Jola Schmidt, MD 08/25/18 1651

## 2018-08-25 NOTE — H&P (Addendum)
The patient has been seen in conjunction with Vin Bhagat, PAC. All aspects of care have been considered and discussed. The patient has been personally interviewed, examined, and all clinical data has been reviewed.   Newly diagnosed atrial flutter, typical.  The patient believes this particular episode is been going on for at least 5 days.  She has had at least 5-6 other episodes, which almost always seem to occur with curling.  She came to the emergency room because this particular episode has lasted so long and also because the schools were closed due to a snow day.  Despite being out of rhythm for the last 5 days she has been able to work, exercise, and to do relatively well without symptoms other than palpitations that make it difficult for her to sleep at night and weakness which is present first thing in the morning with associated dizziness.  Drinks wine daily.  No history of diabetes, stroke, hypertension, vascular disease, sleep apnea, or difficulty with snoring.  Chads Vasc is 1.  Plan IV diltiazem for rate control.  We will simultaneously start immediate release oral diltiazem, which will allow the IV diltiazem to be discontinued if heart rate drops below 60 bpm.  We will start apixaban 5 mg twice daily.  We will hope for spontaneous conversion overnight.  She needs a 2D Doppler echocardiogram.  Depending on how she tolerates rate slowing (in terms of blood pressure and overall energy) could likely be discharged with outpatient follow-up.  Could be a candidate for outpatient electrical cardioversion or ablation since she does not want to be on medication chronically.   Cardiology Consultation:   Patient ID: Kayla Jacobs MRN: [id number]; DOB: [birth year]  Admit date: 08/25/2018 Date of Consult: 08/25/2018  Primary Care Provider: Stephani Police, MD Primary Cardiologist: New to [place] (Dr. Tamala Julian)   Patient Profile:   Kayla Jacobs is a 63 y.o. female with no significant  PMH who is being seen today for the evaluation of atrial flutter at rapid ventricular rate at the request of Dr. Venora Maples.  History of Present Illness:   Kayla Jacobs has history of palpitation for past 2 years.  Each episode lasting for day or 2.  She has constant palpitation since Sunday. This is her 5th episode but longest.  Due to ongoing palpitation, she went to see her PCP today and noted atrial flutter at rapid ventricular rate and sent to ER for further evaluation.  At baseline, patient is very active. She walks 4 miles every day and does lifting every other day.  Due to her active lifestyle she usually does not feel any other symptoms.  However this episode of her palpitation is longest with intermittent associated shortness of breath.  Drinks 1 cup of coffee every day.  No soda.  Drinks 1 glass of wine each night.  Denies illicit drug use or work with smoking.  No family history of CAD, stroke or arrhythmia.  Denies chest pain, orthopnea, PND, syncope, lower extremity edema or melena.  Patient started on IV Cardizem and now heart rate improved to 90-100.  Past Medical History:  Diagnosis Date  . Cancer ([place])    basal cell skin cancer  . Osteopenia     Past Surgical History:  Procedure Laterality Date  . CESAREAN SECTION  1988  . COLONOSCOPY    . [place] SURGERY  2005  . POLYPECTOMY    . TONSILLECTOMY  1962    Inpatient Medications: Scheduled Meds:  Continuous  Infusions: . sodium chloride    . diltiazem (CARDIZEM) infusion 5 mg/hr (08/25/18 1512)   PRN Meds:   Allergies:   No Known Allergies  Social History:   Social History   Socioeconomic History  . Marital status: Married    Spouse name: Not on file  . Number of children: Not on file  . Years of education: Not on file  . Highest education level: Not on file  Occupational History  . Not on file  Social Needs  . Financial resource strain: Not on file  . Food insecurity:    Worry: Not on file    Inability:  Not on file  . Transportation needs:    Medical: Not on file    Non-medical: Not on file  Tobacco Use  . Smoking status: Never Smoker  . Smokeless tobacco: Never Used  Substance and Sexual Activity  . Alcohol use: Yes    Alcohol/week: 7.0 standard drinks    Types: 7 Glasses of wine per week  . Drug use: No  . Sexual activity: Not on file  Lifestyle  . Physical activity:    Days per week: Not on file    Minutes per session: Not on file  . Stress: Not on file  Relationships  . Social connections:    Talks on phone: Not on file    Gets together: Not on file    Attends religious service: Not on file    Active member of club or organization: Not on file    Attends meetings of clubs or organizations: Not on file    Relationship status: Not on file  . Intimate partner violence:    Fear of current or ex partner: Not on file    Emotionally abused: Not on file    Physically abused: Not on file    Forced sexual activity: Not on file  Other Topics Concern  . Not on file  Social History Narrative  . Not on file    Family History:   Family History  Problem Relation Age of Onset  . Esophageal cancer Father   . Colon cancer Neg Hx   . Stomach cancer Neg Hx   . Colon polyps Neg Hx   . Rectal cancer Neg Hx      ROS:  Please see the history of present illness.  All other ROS reviewed and negative.     Physical Exam/Data:   Vitals:   08/25/18 1623 08/25/18 1645 08/25/18 1700 08/25/18 1715  BP:  116/72 104/74   Pulse: (!) 45 63 73 74  Resp: 13 15 14 15   SpO2: 100% 100% 100% 100%  Weight:      Height:       No intake or output data in the 24 hours ending 08/25/18 1738 Last 3 Weights 08/25/2018 11/08/2017 10/25/2017  Weight (lbs) 110 lb 108 lb 107 lb  Weight (kg) 49.896 kg 48.988 kg 48.535 kg     Body mass index is 17.75 kg/m.  General:  Well nourished, well developed, in no acute distres HEENT: normal Lymph: no adenopathy Neck: no JVD Endocrine:  No  thryomegaly Vascular: No carotid bruits; FA pulses 2+ bilaterally without bruits  Cardiac:  normal S1, S2; regular tachycardic; no murmur  Lungs:  clear to auscultation bilaterally, no wheezing, rhonchi or rales  Abd: soft, nontender, no hepatomegaly  Ext: no edema Musculoskeletal:  No deformities, BUE and BLE strength normal and equal Skin: warm and dry  Neuro:  CNs 2-12 intact, no  focal abnormalities noted Psych:  Normal affect   EKG:  The EKG was personally reviewed and demonstrates: Atrial flutter at rate of 143 bpm with 2-1 conduction   Relevant CV Studies: As summarized above  Laboratory Data:  Chemistry Recent Labs  Lab 08/25/18 1457  NA 140  K 4.0  CL 106  CO2 24  GLUCOSE 98  BUN 12  CREATININE 1.07*  CALCIUM 9.9  GFRNONAA 56*  GFRAA >60  ANIONGAP 10    Recent Labs  Lab 08/25/18 1457  PROT 6.8  ALBUMIN 4.4  AST 33  ALT 24  ALKPHOS 37*  BILITOT 1.3*   Hematology Recent Labs  Lab 08/25/18 1457  WBC 4.6  RBC 4.28  HGB 13.7  HCT 42.5  MCV 99.3  MCH 32.0  MCHC 32.2  RDW 11.9  PLT 177   Cardiac Enzymes Recent Labs  Lab 08/25/18 1457  TROPONINI <0.03   Radiology/Studies:  Dg Chest Portable 1 View  Result Date: 08/25/2018 CLINICAL DATA:  Chest palpitations today. EXAM: PORTABLE CHEST 1 VIEW COMPARISON:  None. FINDINGS: The lungs are clear. Heart size is normal. Aortic atherosclerosis is noted. No pneumothorax or pleural effusion. No acute or focal bony abnormality IMPRESSION: No acute disease. Atherosclerosis. Electronically Signed   By: Inge Rise M.D.   On: 08/25/2018 15:05    Assessment and Plan:   1. Atrial flutter with  rapid ventricular rate -This is her fifth episode in past 2 years.  Usually her palpitation resolved within a day or 2 however this time palpitation is constant since past 5 days.  Minimally symptomatic due to active lifestyle.  TSH normal.  Advised complete cessation of caffeinated products. -Heart rate currently  improved on IV Cardizem. CHADSVAC score of 1 for sex only.  - Will change cardizem to short acting p.o. and consolidate to long-acting tomorrow pending heart rate response.  Echocardiogram in the morning.  Likely outpatient cardioversion if does not convert.  Will admit for observation.   For questions or updates, please contact [facility] Please consult [email or web address] for contact info under     Jarrett Soho, [place]  08/25/2018 5:38 PM

## 2018-08-25 NOTE — ED Notes (Signed)
ED TO INPATIENT HANDOFF REPORT  Name/Age/Gender Kayla Jacobs 63 y.o. female  Code Status   Home/SNF/Other Home  Chief Complaint afib flutter  Level of Care/Admitting Diagnosis ED Disposition    ED Disposition Condition [facility] Area: [place] [100100]  Level of Care: Cardiac Telemetry [103]  Diagnosis: Atrial flutter ([place]) [427.32.ICD-9-CM]  Admitting Physician: Belva Crome [[id number]]  Attending Physician: Belva Crome [[id number]]  PT Class (Do Not Modify): Observation [104]  PT Acc Code (Do Not Modify): Observation [10022]       Medical History Past Medical History:  Diagnosis Date  . Cancer ([place])    basal cell skin cancer  . Osteopenia     Allergies No Known Allergies  IV Location/Drains/Wounds Patient Lines/Drains/Airways Status   Active Line/Drains/Airways    Name:   Placement date:   Placement time:   Site:   Days:   Peripheral IV 08/25/18 Right Antecubital   08/25/18    1456    Antecubital   less than 1          Labs/Imaging Results for orders placed or performed during the hospital encounter of 08/25/18 (from the past 48 hour(s))  CBC with Differential/Platelet     Status: None   Collection Time: 08/25/18  2:57 PM  Result Value Ref Range   WBC 4.6 4.0 - 10.5 K/uL   RBC 4.28 3.87 - 5.11 MIL/uL   Hemoglobin 13.7 12.0 - 15.0 g/dL   HCT 42.5 36.0 - 46.0 %   MCV 99.3 80.0 - 100.0 fL   MCH 32.0 26.0 - 34.0 pg   MCHC 32.2 30.0 - 36.0 g/dL   RDW 11.9 11.5 - 15.5 %   Platelets 177 150 - 400 K/uL   nRBC 0.0 0.0 - 0.2 %   Neutrophils Relative % 60 %   Neutro Abs 2.7 1.7 - 7.7 K/uL   Lymphocytes Relative 28 %   Lymphs Abs 1.3 0.7 - 4.0 K/uL   Monocytes Relative 11 %   Monocytes Absolute 0.5 0.1 - 1.0 K/uL   Eosinophils Relative 0 %   Eosinophils Absolute 0.0 0.0 - 0.5 K/uL   Basophils Relative 1 %   Basophils Absolute 0.0 0.0 - 0.1 K/uL   Immature Granulocytes 0 %   Abs Immature Granulocytes 0.01 0.00 - 0.07  K/uL    Comment: Performed at [facility], [address], [place], [place] [address]  Comprehensive metabolic panel     Status: Abnormal   Collection Time: 08/25/18  2:57 PM  Result Value Ref Range   Sodium 140 135 - 145 mmol/L   Potassium 4.0 3.5 - 5.1 mmol/L   Chloride 106 98 - 111 mmol/L   CO2 24 22 - 32 mmol/L   Glucose, Bld 98 70 - 99 mg/dL   BUN 12 8 - 23 mg/dL   Creatinine, Ser 1.07 (H) 0.44 - 1.00 mg/dL   Calcium 9.9 8.9 - 10.3 mg/dL   Total Protein 6.8 6.5 - 8.1 g/dL   Albumin 4.4 3.5 - 5.0 g/dL   AST 33 15 - 41 U/L   ALT 24 0 - 44 U/L   Alkaline Phosphatase 37 (L) 38 - 126 U/L   Total Bilirubin 1.3 (H) 0.3 - 1.2 mg/dL   GFR calc non Af Amer 56 (L) >60 mL/min   GFR calc Af Amer >60 >60 mL/min   Anion gap 10 5 - 15    Comment: Performed at [facility], [place] [address], [place], [place] [address]  Troponin I - ONCE - STAT     Status: None   Collection Time: 08/25/18  2:57 PM  Result Value Ref Range   Troponin I <0.03 <0.03 ng/mL    Comment: Performed at [facility], [place] [address], [place], [place] [address]  TSH     Status: None   Collection Time: 08/25/18  2:57 PM  Result Value Ref Range   TSH 4.332 0.350 - 4.500 uIU/mL    Comment: Performed by a 3rd Generation assay with a functional sensitivity of <=0.01 uIU/mL. Performed at [facility], [place] [address] [place], [place] [address]   Magnesium     Status: None   Collection Time: 08/25/18  2:57 PM  Result Value Ref Range   Magnesium 1.7 1.7 - 2.4 mg/dL    Comment: Performed at [facility], [place] [address], [place], [place] [address]   Dg Chest Portable 1 View  Result Date: 08/25/2018 CLINICAL DATA:  Chest palpitations today. EXAM: PORTABLE CHEST 1 VIEW COMPARISON:  None. FINDINGS: The lungs are clear. Heart size is normal. Aortic atherosclerosis is noted. No pneumothorax or pleural effusion. No acute or focal bony abnormality IMPRESSION: No acute disease. Atherosclerosis.  Electronically Signed   By: Inge Rise M.D.   On: 08/25/2018 15:05    Pending Labs [employer] (From admission, onward)    Start     Ordered   Signed and Held  HIV antibody (Routine Testing)  Once,   R     Signed and Held   Signed and Occupational hygienist morning,   R     Signed and Held   Signed and Held  CBC  Tomorrow morning,   R     Signed and Held   Signed and Held  Lipid panel  Tomorrow morning,   R     Signed and Held          Vitals/Pain Today's Vitals   08/25/18 1700 08/25/18 1715 08/25/18 1730 08/25/18 1800  BP: 104/74  117/71 125/72  Pulse: 73 74 63 68  Resp: 14 15 19 16   SpO2: 100% 100% 100% 99%  Weight:      Height:      PainSc:        Isolation Precautions No active isolations  Medications Medications  diltiazem (CARDIZEM) 100 mg in dextrose 5% 155mL (1 mg/mL) infusion (5 mg/hr Intravenous New Bag/Given 08/25/18 1512)  diltiazem (CARDIZEM) injection 20 mg (20 mg Intravenous Given 08/25/18 1509)  diltiazem (CARDIZEM) injection 15 mg (15 mg Intravenous Given 08/25/18 1651)    Mobility walks

## 2018-08-25 NOTE — ED Triage Notes (Signed)
Pt sent here by PCP. Pt complains of heart racing and SOB. Pt sent here for atrial fib.

## 2018-08-26 ENCOUNTER — Observation Stay (HOSPITAL_BASED_OUTPATIENT_CLINIC_OR_DEPARTMENT_OTHER): Payer: BC Managed Care – PPO

## 2018-08-26 DIAGNOSIS — I4892 Unspecified atrial flutter: Secondary | ICD-10-CM

## 2018-08-26 DIAGNOSIS — I483 Typical atrial flutter: Secondary | ICD-10-CM | POA: Diagnosis not present

## 2018-08-26 LAB — CBC
HCT: 36.6 % (ref 36.0–46.0)
Hemoglobin: 12.5 g/dL (ref 12.0–15.0)
MCH: 32.7 pg (ref 26.0–34.0)
MCHC: 34.2 g/dL (ref 30.0–36.0)
MCV: 95.8 fL (ref 80.0–100.0)
Platelets: 171 10*3/uL (ref 150–400)
RBC: 3.82 MIL/uL — ABNORMAL LOW (ref 3.87–5.11)
RDW: 11.8 % (ref 11.5–15.5)
WBC: 5.3 10*3/uL (ref 4.0–10.5)
nRBC: 0 % (ref 0.0–0.2)

## 2018-08-26 LAB — LIPID PANEL
Cholesterol: 192 mg/dL (ref 0–200)
HDL: 85 mg/dL (ref >40)
LDL Cholesterol: 101 mg/dL — ABNORMAL HIGH (ref 0–99)
Total CHOL/HDL Ratio: 2.3 RATIO
Triglycerides: 30 mg/dL (ref <150)
VLDL: 6 mg/dL (ref 0–40)

## 2018-08-26 LAB — BASIC METABOLIC PANEL
Anion gap: 9 (ref 5–15)
BUN: 13 mg/dL (ref 8–23)
CO2: 27 mmol/L (ref 22–32)
Calcium: 9.4 mg/dL (ref 8.9–10.3)
Chloride: 103 mmol/L (ref 98–111)
Creatinine, Ser: 0.88 mg/dL (ref 0.44–1.00)
GFR calc Af Amer: >60 mL/min (ref >60)
GFR calc non Af Amer: >60 mL/min (ref >60)
Glucose, Bld: 89 mg/dL (ref 70–99)
Potassium: 3.6 mmol/L (ref 3.5–5.1)
Sodium: 139 mmol/L (ref 135–145)

## 2018-08-26 LAB — ECHOCARDIOGRAM COMPLETE
Height: 66 in
WEIGHTICAEL: 1684.8 [oz_av]

## 2018-08-26 LAB — HIV ANTIBODY (ROUTINE TESTING W REFLEX): HIV Screen 4th Generation wRfx: NONREACTIVE

## 2018-08-26 MED ORDER — DILTIAZEM HCL ER COATED BEADS 120 MG PO CP24: 120.0000 mg | ORAL_CAPSULE | Freq: Every day | ORAL | 0 refills | Status: DC

## 2018-08-26 MED ORDER — APIXABAN 5 MG PO TABS: 5.0000 mg | ORAL_TABLET | Freq: Two times a day (BID) | ORAL | 0 refills | Status: DC

## 2018-08-26 NOTE — Progress Notes (Signed)
  Echocardiogram 2D Echocardiogram has been performed.  Darlina Sicilian M 08/26/2018, 10:40 AM

## 2018-08-26 NOTE — Care Management (Signed)
Patient provided with 30 day Eliquis card and $10 copay card. No further CM needs

## 2018-08-26 NOTE — Discharge Instructions (Signed)
Information on my medicine - ELIQUIS® (apixaban) ° °This medication education was reviewed with me or my healthcare representative as part of my discharge preparation.  The pharmacist that spoke with me during my hospital stay was:  ° °Why was Eliquis® prescribed for you? °Eliquis® was prescribed for you to reduce the risk of forming blood clots that can cause a stroke if you have a medical condition called atrial fibrillation (a type of irregular heartbeat) OR to reduce the risk of a blood clots forming after orthopedic surgery. ° °What do You need to know about Eliquis® ? °Take your Eliquis® TWICE DAILY - one tablet in the morning and one tablet in the evening with or without food.  It would be best to take the doses about the same time each day. ° °If you have difficulty swallowing the tablet whole please discuss with your pharmacist how to take the medication safely. ° °Take Eliquis® exactly as prescribed by your doctor and DO NOT stop taking Eliquis® without talking to the doctor who prescribed the medication.  Stopping may increase your risk of developing a new clot or stroke.  Refill your prescription before you run out. ° °After discharge, you should have regular check-up appointments with your healthcare provider that is prescribing your Eliquis®.  In the future your dose may need to be changed if your kidney function or weight changes by a significant amount or as you get older. ° °What do you do if you miss a dose? °If you miss a dose, take it as soon as you remember on the same day and resume taking twice daily.  Do not take more than one dose of ELIQUIS at the same time. ° °Important Safety Information °A possible side effect of Eliquis® is bleeding. You should call your healthcare provider right away if you experience any of the following: °? Bleeding from an injury or your nose that does not stop. °? Unusual colored urine (red or dark brown) or unusual colored stools (red or black). °? Unusual  bruising for unknown reasons. °? A serious fall or if you hit your head (even if there is no bleeding). ° °Some medicines may interact with Eliquis® and might increase your risk of bleeding or clotting while on Eliquis®. To help avoid this, consult your healthcare provider or pharmacist prior to using any new prescription or non-prescription medications, including herbals, vitamins, non-steroidal anti-inflammatory drugs (NSAIDs) and supplements. ° °This website has more information on Eliquis® (apixaban): www.Eliquis.com. ° ° °

## 2018-08-26 NOTE — Discharge Summary (Addendum)
Discharge Summary    Patient ID: CYENNA MCWRIGHT,  MRN: [id number], DOB/AGE: 1956-01-19 63 y.o.  Admit date: 08/25/2018 Discharge date: 08/26/2018  Primary Care Provider: Christen Bame Primary Cardiologist: Dr. Ladona Ridgel  Discharge Diagnoses    Active Problems:   Atrial flutter [facility])   Allergies No Known Allergies  Diagnostic Studies/Procedures    TTE: 08/26/2018  Completed, but read pending at the time of discharge _____________   History of Present Illness     Ms. Harrison has history of palpitation for past 2 years.  Each episode lasting for day or 2.  She presented with constant palpitation for the past 5 days prior to admission. The episode that brought her in was the 5th episode but lasted the longest.  Due to ongoing palpitation, she went to see her PCP and noted atrial flutter at rapid ventricular rate, who sent to ER for further evaluation.  At baseline, patient is very active. She walks 4 miles every day and does lifting every other day.  Due to her active lifestyle she usually does not feel any other symptoms.  However this episode of her palpitation was the longest with intermittent associated shortness of breath.  Drinks 1 cup of coffee every day. No soda.  Drinks 1 glass of wine each night.  Denied illicit drug use or smoking.  No family history of CAD, stroke or arrhythmia.  Denied chest pain, orthopnea, PND, syncope, lower extremity edema or melena. She was seen by cardiology in the ED and admitted for further work up.   Hospital Course     Started on Dilt gtt in the ED. Seen by Dr. Katrinka Blazing and started in oral Dilt as well as Eliquis 5mg  BID. ChadsVasc of 1. She converted to SR overnight. Cardiezm was consolidated to 120mg  daily. Plan will be for Eliquis 5mg  BID for 3 weeks at the time of discharge. Echo completed but read pending at the time of discharge. Follow up sent via message to the office.   Di Kindle was seen by Dr. Ladona Ridgel and determined stable for  discharge home. Follow up in the office has been arranged. Medications are listed below.   _____________  Discharge Vitals Blood pressure 110/73, pulse 63, temperature 97.8 F (36.6 C), temperature source Oral, resp. rate 20, height 5\' 6"  (1.676 m), weight 47.8 kg, SpO2 99 %.  Filed Weights   08/25/18 1427 08/25/18 1931 08/26/18 0425  Weight: 49.9 kg 48.2 kg 47.8 kg    Labs & Radiologic Studies    CBC Recent Labs    08/25/18 1457 08/26/18 0417  WBC 4.6 5.3  NEUTROABS 2.7  --   HGB 13.7 12.5  HCT 42.5 36.6  MCV 99.3 95.8  PLT 177 171   Basic Metabolic Panel Recent Labs    78/29/56 1457 08/26/18 0417  NA 140 139  K 4.0 3.6  CL 106 103  CO2 24 27  GLUCOSE 98 89  BUN 12 13  CREATININE 1.07* 0.88  CALCIUM 9.9 9.4  MG 1.7  --    Liver Function Tests Recent Labs    08/25/18 1457  AST 33  ALT 24  ALKPHOS 37*  BILITOT 1.3*  PROT 6.8  ALBUMIN 4.4   No results for input(s): LIPASE, AMYLASE in the last 72 hours. Cardiac Enzymes Recent Labs    08/25/18 1457  TROPONINI <0.03   BNP Invalid input(s): POCBNP D-Dimer No results for input(s): DDIMER in the last 72 hours. Hemoglobin A1C No results for input(s):  HGBA1C in the last 72 hours. Fasting Lipid Panel Recent Labs    08/26/18 0417  CHOL 192  HDL 85  LDLCALC 101*  TRIG 30  CHOLHDL 2.3   Thyroid Function Tests Recent Labs    08/25/18 1457  TSH 4.332   _____________  Dg Chest Portable 1 View  Result Date: 08/25/2018 CLINICAL DATA:  Chest palpitations today. EXAM: PORTABLE CHEST 1 VIEW COMPARISON:  None. FINDINGS: The lungs are clear. Heart size is normal. Aortic atherosclerosis is noted. No pneumothorax or pleural effusion. No acute or focal bony abnormality IMPRESSION: No acute disease. Atherosclerosis. Electronically Signed   By: Drusilla Kanner M.D.   On: 08/25/2018 15:05   Disposition   Pt is being discharged home today in good condition.  Follow-up Plans & Appointments    Follow-up  Information    Marinus Maw, MD Follow up.   Specialty:  Cardiology Why:  The office will call you with a follow up appt.  Contact information: [address] [place] [address] [phone]          Discharge Instructions    Diet - low sodium heart healthy   Complete by:  As directed    Increase activity slowly   Complete by:  As directed       Discharge Medications     Medication List    TAKE these medications   apixaban 5 MG Tabs tablet Commonly known as:  ELIQUIS Take 1 tablet (5 mg total) by mouth 2 (two) times daily.   diltiazem 120 MG 24 hr capsule Commonly known as:  CARDIZEM CD Take 1 capsule (120 mg total) by mouth daily for 30 days.   NON FORMULARY Take 1 tablet by mouth See admin instructions. VitafusionT Gummy Vitamins for Women- Chew 1 gummie by mouth once a day   vitamin C 500 MG tablet Commonly known as:  ASCORBIC ACID Take 1,000 mg by mouth daily.   Vitamin D3 25 MCG (1000 UT) Chew Chew 2,000 Units by mouth daily.       Acute coronary syndrome (MI, NSTEMI, STEMI, etc) this admission?: No.     Outstanding Labs/Studies   N/a  Duration of Discharge Encounter   Greater than 30 minutes including physician time.  Signed, Laverda Page NP-C 08/26/2018, 12:14 PM   Cardiology Attending  Patient seen and examined. Agree with above. The patient has reverted back to NSR from atrial flutter. Her exam is normal today.  I have recommended she be discharged home with 3 weeks of Eliquis. I will see her back in the office for EP consult. Her echo is pending at [facility].   Leonia Reeves.D.

## 2018-09-07 ENCOUNTER — Encounter: Payer: Self-pay | Admitting: Internal Medicine

## 2018-09-07 ENCOUNTER — Ambulatory Visit (INDEPENDENT_AMBULATORY_CARE_PROVIDER_SITE_OTHER): Payer: BC Managed Care – PPO | Admitting: Internal Medicine

## 2018-09-07 VITALS — BP 136/74 | HR 63 | Ht 66.0 in | Wt 106.8 lb

## 2018-09-07 DIAGNOSIS — I4892 Unspecified atrial flutter: Secondary | ICD-10-CM

## 2018-09-07 NOTE — Patient Instructions (Addendum)
Medication Instructions:  Your physician recommends that you continue on your current medications as directed. Please refer to the Current Medication list given to you today.  You can stop taking Eliquis on September 16, 2018  Labwork: None ordered.  Testing/Procedures: Your physician has recommended that you have an ablation. Catheter ablation is a medical procedure used to treat some cardiac arrhythmias (irregular heartbeats). During catheter ablation, a long, thin, flexible tube is put into a blood vessel in your groin (upper thigh), or neck. This tube is called an ablation catheter. It is then guided to your heart through the blood vessel. Radio frequency waves destroy small areas of heart tissue where abnormal heartbeats may cause an arrhythmia to start. Please see the instruction sheet given to you today.  Follow-Up:   If you decide on a day please give me a call:  Bing Neighbors [phone]  Ablation days:  March 16, 23, 30 April 6, 9, 20, 23, 27    Cardiac Ablation Cardiac ablation is a procedure to disable (ablate) a small amount of heart tissue in very specific places. The heart has many electrical connections. Sometimes these connections are abnormal and can cause the heart to beat very fast or irregularly. Ablating some of the problem areas can improve the heart rhythm or return it to normal. Ablation may be done for people who:  Have Wolff-Parkinson-White syndrome.  Have fast heart rhythms (tachycardia).  Have taken medicines for an abnormal heart rhythm (arrhythmia) that were not effective or caused side effects.  Have a high-risk heartbeat that may be life-threatening. During the procedure, a small incision is made in the neck or the groin, and a long, thin, flexible tube (catheter) is inserted into the incision and moved to the heart. Small devices (electrodes) on the tip of the catheter will send out electrical currents. A type of X-ray (fluoroscopy) will be used to help  guide the catheter and to provide images of the heart. Tell a health care provider about:  Any allergies you have.  All medicines you are taking, including vitamins, herbs, eye drops, creams, and over-the-counter medicines.  Any problems you or family members have had with anesthetic medicines.  Any blood disorders you have.  Any surgeries you have had.  Any medical conditions you have, such as kidney failure.  Whether you are pregnant or may be pregnant. What are the risks? Generally, this is a safe procedure. However, problems may occur, including:  Infection.  Bruising and bleeding at the catheter insertion site.  Bleeding into the chest, especially into the sac that surrounds the heart. This is a serious complication.  Stroke or blood clots.  Damage to other structures or organs.  Allergic reaction to medicines or dyes.  Need for a permanent pacemaker if the normal electrical system is damaged. A pacemaker is a small computer that sends electrical signals to the heart and helps your heart beat normally.  The procedure not being fully effective. This may not be recognized until months later. Repeat ablation procedures are sometimes required. What happens before the procedure?  Follow instructions from your health care provider about eating or drinking restrictions.  Ask your health care provider about: ? Changing or stopping your regular medicines. This is especially important if you are taking diabetes medicines or blood thinners. ? Taking medicines such as aspirin and ibuprofen. These medicines can thin your blood. Do not take these medicines before your procedure if your health care provider instructs you not to.  Plan to have  someone take you home from the hospital or clinic.  If you will be going home right after the procedure, plan to have someone with you for 24 hours. What happens during the procedure?  To lower your risk of infection: ? Your health care  team will wash or sanitize their hands. ? Your skin will be washed with soap. ? Hair may be removed from the incision area.  An IV tube will be inserted into one of your veins.  You will be given a medicine to help you relax (sedative).  The skin on your neck or groin will be numbed.  An incision will be made in your neck or your groin.  A needle will be inserted through the incision and into a large vein in your neck or groin.  A catheter will be inserted into the needle and moved to your heart.  Dye may be injected through the catheter to help your surgeon see the area of the heart that needs treatment.  Electrical currents will be sent from the catheter to ablate heart tissue in desired areas. There are three types of energy that may be used to ablate heart tissue: ? Heat (radiofrequency energy). ? Laser energy. ? Extreme cold (cryoablation).  When the necessary tissue has been ablated, the catheter will be removed.  Pressure will be held on the catheter insertion area to prevent excessive bleeding.  A bandage (dressing) will be placed over the catheter insertion area. The procedure may vary among health care providers and hospitals. What happens after the procedure?  Your blood pressure, heart rate, breathing rate, and blood oxygen level will be monitored until the medicines you were given have worn off.  Your catheter insertion area will be monitored for bleeding. You will need to lie still for a few hours to ensure that you do not bleed from the catheter insertion area.  Do not drive for 24 hours or as long as directed by your health care provider. Summary  Cardiac ablation is a procedure to disable (ablate) a small amount of heart tissue in very specific places. Ablating some of the problem areas can improve the heart rhythm or return it to normal.  During the procedure, electrical currents will be sent from the catheter to ablate heart tissue in desired areas. This  information is not intended to replace advice given to you by your health care provider. Make sure you discuss any questions you have with your health care provider. Document Released: 11/07/2008 Document Revised: 05/10/2016 Document Reviewed: 05/10/2016 Elsevier Interactive Patient Education  2019 [employer].

## 2018-09-07 NOTE — Progress Notes (Signed)
HPI Kayla Jacobs returns today for followup of atrial flutter. She is a healthy middle aged woman with a h/o palpitations on multiple occaisions lasting hours who presented a few weeks ago and was found to be in atrial flutter with a RVR. She spontaneously reverted back to NSR. She has been placed on Eliquis and diltiazem and returns for followup. She has felt well in the interim without any bleeding or additional palpitations.  No Known Allergies   Current Outpatient Medications  Medication Sig Dispense Refill  . apixaban (ELIQUIS) 5 MG TABS tablet Take 1 tablet (5 mg total) by mouth 2 (two) times daily. 60 tablet 0  . Cholecalciferol (VITAMIN D3) 1000 units CHEW Chew 2,000 Units by mouth daily.     Marland Kitchen diltiazem (CARDIZEM CD) 120 MG 24 hr capsule Take 1 capsule (120 mg total) by mouth daily for 30 days. 30 capsule 0  . NON FORMULARY Take 1 tablet by mouth See admin instructions. VitafusionT Gummy Vitamins for Women- Chew 1 gummie by mouth once a day     . vitamin C (ASCORBIC ACID) 500 MG tablet Take 1,000 mg by mouth daily.    Marland Kitchen azithromycin (ZITHROMAX) 250 MG tablet Take 1 tablet by mouth daily.     No current facility-administered medications for this visit.      Past Medical History:  Diagnosis Date  . Cancer (HCC)    basal cell skin cancer  . Osteopenia     ROS:   All systems reviewed and negative except as noted in the HPI.   Past Surgical History:  Procedure Laterality Date  . CESAREAN SECTION  1988  . COLONOSCOPY    . LUMBAR DISC SURGERY  2005  . POLYPECTOMY    . TONSILLECTOMY  1962     Family History  Problem Relation Age of Onset  . Esophageal cancer Father   . Colon cancer Neg Hx   . Stomach cancer Neg Hx   . Colon polyps Neg Hx   . Rectal cancer Neg Hx      Social History   Socioeconomic History  . Marital status: Married    Spouse name: Not on file  . Number of children: Not on file  . Years of education: Not on file  . Highest education  level: Not on file  Occupational History  . Not on file  Social Needs  . Financial resource strain: Not on file  . Food insecurity:    Worry: Not on file    Inability: Not on file  . Transportation needs:    Medical: Not on file    Non-medical: Not on file  Tobacco Use  . Smoking status: Never Smoker  . Smokeless tobacco: Never Used  Substance and Sexual Activity  . Alcohol use: Yes    Alcohol/week: 7.0 standard drinks    Types: 7 Glasses of wine per week  . Drug use: No  . Sexual activity: Not on file  Lifestyle  . Physical activity:    Days per week: Not on file    Minutes per session: Not on file  . Stress: Not on file  Relationships  . Social connections:    Talks on phone: Not on file    Gets together: Not on file    Attends religious service: Not on file    Active member of club or organization: Not on file    Attends meetings of clubs or organizations: Not on file    Relationship status:  Not on file  . Intimate partner violence:    Fear of current or ex partner: Not on file    Emotionally abused: Not on file    Physically abused: Not on file    Forced sexual activity: Not on file  Other Topics Concern  . Not on file  Social History Narrative  . Not on file     BP 136/74   Pulse 63   Ht 5\' 6"  (1.676 m)   Wt 106 lb 12.8 oz (48.4 kg)   SpO2 99%   BMI 17.24 kg/m   Physical Exam:  Well appearing middle aged woman, NAD HEENT: Unremarkable Neck:  6 cm JVD, no thyromegally Lymphatics:  No adenopathy Back:  No CVA tenderness Lungs:  Clear with no wheezes HEART:  Regular rate rhythm, no murmurs, no rubs, no clicks Abd:  soft, positive bowel sounds, no organomegally, no rebound, no guarding Ext:  2 plus pulses, no edema, no cyanosis, no clubbing Skin:  No rashes no nodules Neuro:  CN II through XII intact, motor grossly intact  EKG - nsr   Assess/Plan: 1. Atrial flutter - she is maintaining NSR. As she has had several episodes with clear cut atrial  flutter, I have offered her catheter ablation. She will take the eliquis for a total of 3 weeks. She will call us if she wishes to consider catheter ablation of atrial flutter. I have reviewed the risks/benefits/goals/expectations of the procedure. Her CHADSVASC is 1. She does not carry a diagnosis of HTN. I spent over 25 minutes including 50% face to face time with the patient producing this note. Leonia Reeves.D.

## 2018-09-20 ENCOUNTER — Other Ambulatory Visit (HOSPITAL_COMMUNITY): Payer: Self-pay | Admitting: Cardiology

## 2018-10-25 ENCOUNTER — Other Ambulatory Visit: Payer: Self-pay | Admitting: *Deleted

## 2018-10-27 MED ORDER — DILTIAZEM HCL ER COATED BEADS 120 MG PO CP24: 120.0000 mg | ORAL_CAPSULE | Freq: Every day | ORAL | 3 refills | Status: DC

## 2018-10-31 ENCOUNTER — Other Ambulatory Visit: Payer: Self-pay

## 2018-10-31 ENCOUNTER — Telehealth: Payer: Self-pay | Admitting: Internal Medicine

## 2018-10-31 MED ORDER — DILTIAZEM HCL ER COATED BEADS 240 MG PO CP24: 240.0000 mg | ORAL_CAPSULE | Freq: Every day | ORAL | 3 refills | Status: DC

## 2018-10-31 MED ORDER — APIXABAN 5 MG PO TABS: 5.0000 mg | ORAL_TABLET | Freq: Two times a day (BID) | ORAL | 3 refills | Status: DC

## 2018-10-31 NOTE — Telephone Encounter (Signed)
Pt called to report that her HR since 5 am has been 136... her BP at 7 am was 109/82.. she is asymptomatic but was told last time she was in aflutter with RVR that she should have called [place] sooner but she converted to NSR then on her own. Pt has had Diltiazem 120 mg today and her Eliquis this morning... [place] OV with Dr. 09/2018. Will rest and continue to monitor.. will forward to APP for her advice and recommendation.   Pt has declined ablation in the past.

## 2018-10-31 NOTE — Telephone Encounter (Signed)
Message received from Triage. I returned patient call.   She is feeling well, but her HR has been in the 130s since about 5AM. She is asymptomatic. She was taken off of anticoagulation. She was told the next time she was "out of rhythm" she should call immediately for possible cardioversion if it has been less than 48 hr. I am concerned she is back in Afib/flutter without anticoagulation. She needs and EKG and does not wish to go to the ER.   I have asked Triage to arrange an in-person visit today with a DOD at [facility] or [place] (patient can travel to either). She needs and EKG to confirm rhythm and to be placed back on [facility], possible DCCV.  Tami Lin Duke, PA-C 10/31/2018, 10:36 AM

## 2018-10-31 NOTE — Progress Notes (Addendum)
Per Dr. Lovena Le... pt to increase her Diltiazem to 240 mg a day and to restart her Eliquis... pt declined EKG since she is not ready to consider Ablation.. will still like virtual visit with Dr. Lovena Le 11/03/18.      Virtual Visit Pre-Appointment Phone Call  1. "What is the BEST phone number to call the day of the visit?" - include this in appointment notes..[phone]  2. "Do you have or have access to (through a family member/friend) a smartphone with video capability that we can use for your visit?" YES  3. Confirm consent - "In the setting of the current Covid19 crisis, you are scheduled for a (phone or video) visit with your provider on 11/03/18 at 1:45pm.  Just as we do with many in-office visits, in order for you to participate in this visit, we must obtain consent.  If you'd like, I can send this to your mychart (if signed up) or email for you to review.  Otherwise, I can obtain your verbal consent now.  All virtual visits are billed to your insurance company just like a normal visit would be.  By agreeing to a virtual visit, we'd like you to understand that the technology does not allow for your provider to perform an examination, and thus may limit your provider's ability to fully assess your condition. If your provider identifies any concerns that need to be evaluated in person, we will make arrangements to do so.  Finally, though the technology is pretty good, we cannot assure that it will always work on either your or our end, and in the setting of a video visit, we may have to convert it to a phone-only visit.  In either situation, we cannot ensure that we have a secure connection.  Are you willing to proceed?" STAFF: Did the patient verbally acknowledge consent to telehealth visit? Document YES/NO here: YES  4. Advise patient to be prepared - "Two hours prior to your appointment, go ahead and check your blood pressure, pulse, oxygen saturation, and your weight (if you have the equipment to  check those) and write them all down. When your visit starts, your provider will ask you for this information. If you have an Apple Watch or Kardia device, please plan to have heart rate information ready on the day of your appointment. Please have a pen and paper handy nearby the day of the visit as well."  5. Give patient instructions for MyChart download to smartphone OR [email or web address] as below if video visit (depending on what platform provider is using)  6. Inform patient they will receive a phone call 15 minutes prior to their appointment time (may be from unknown caller ID) so they should be prepared to answer    TELEPHONE CALL NOTE  Kayla Jacobs has been deemed a candidate for a follow-up tele-health visit to limit community exposure during the Covid-19 pandemic. I spoke with the patient via phone to ensure availability of phone/video source, confirm preferred email & phone number, and discuss instructions and expectations.  I reminded Kayla Jacobs to be prepared with any vital sign and/or heart rhythm information that could potentially be obtained via home monitoring, at the time of her visit. I reminded Kayla Jacobs to expect a phone call prior to her visit.  FULL LENGTH CONSENT FOR TELE-HEALTH VISIT   I hereby voluntarily request, consent and authorize [facility] and its employed or contracted physicians, Engineer, materials, nurse practitioners or other licensed health care professionals (the Practitioner),  to provide me with telemedicine health care services (the "Services") as deemed necessary by the treating Practitioner. I acknowledge and consent to receive the Services by the Practitioner via telemedicine. I understand that the telemedicine visit will involve communicating with the Practitioner through live audiovisual communication technology and the disclosure of certain medical information by electronic transmission. I acknowledge that I have been given the opportunity to  request an in-person assessment or other available alternative prior to the telemedicine visit and am voluntarily participating in the telemedicine visit.  I understand that I have the right to withhold or withdraw my consent to the use of telemedicine in the course of my care at any time, without affecting my right to future care or treatment, and that the Practitioner or I may terminate the telemedicine visit at any time. I understand that I have the right to inspect all information obtained and/or recorded in the course of the telemedicine visit and may receive copies of available information for a reasonable fee.  I understand that some of the potential risks of receiving the Services via telemedicine include:  Marland Kitchen Delay or interruption in medical evaluation due to technological equipment failure or disruption; . Information transmitted may not be sufficient (e.g. poor resolution of images) to allow for appropriate medical decision making by the Practitioner; and/or  . In rare instances, security protocols could fail, causing a breach of personal health information.  Furthermore, I acknowledge that it is my responsibility to provide information about my medical history, conditions and care that is complete and accurate to the best of my ability. I acknowledge that Practitioner's advice, recommendations, and/or decision may be based on factors not within their control, such as incomplete or inaccurate data provided by me or distortions of diagnostic images or specimens that may result from electronic transmissions. I understand that the practice of medicine is not an exact science and that Practitioner makes no warranties or guarantees regarding treatment outcomes. I acknowledge that I will receive a copy of this consent concurrently upon execution via email to the email address I last provided but may also request a printed copy by calling the office of [facility].    I understand that my insurance  will be billed for this visit.   I have read or had this consent read to me. . I understand the contents of this consent, which adequately explains the benefits and risks of the Services being provided via telemedicine.  . I have been provided ample opportunity to ask questions regarding this consent and the Services and have had my questions answered to my satisfaction. . I give my informed consent for the services to be provided through the use of telemedicine in my medical care  By participating in this telemedicine visit I agree to the above.  PT GAVE VERBAL CONSENT.

## 2018-10-31 NOTE — Telephone Encounter (Signed)
New message  STAT if HR is under 50 or over 120 (normal HR is 60-100 beats per minute)  1) What is your heart rate? 138  On today  2) Do you have a log of your heart rate readings (document readings)? Yes, can provide if needed   3) Do you have any other symptoms? Patient has fluttering of the heart

## 2018-11-01 ENCOUNTER — Telehealth: Payer: Self-pay | Admitting: Internal Medicine

## 2018-11-01 NOTE — Telephone Encounter (Signed)
Patient is still having issue with atrial flutter and she spoke with the nurse yesterday and was told to increase her medication. Since it is still happening she would like to know is it would be okay to increase it some more

## 2018-11-02 NOTE — Telephone Encounter (Signed)
Returned call to Pt.  Advised per Dr. Lovena Le ok to increase diltiazem to 360 mg daily.  Pt has virtual visit with Dr. Lovena Le tomorrow.

## 2018-11-02 NOTE — Telephone Encounter (Signed)
New Message    Pt c/o medication issue:  1. Name of Medication: Diltiazem  2. How are you currently taking this medication (dosage and times per day)? 240mg  once a day in the morning   3. Are you having a reaction (difficulty breathing--STAT)? NO  4. What is your medication issue? Patient wants to know if the dosage could be increased.  Please give her a call back.

## 2018-11-02 NOTE — Telephone Encounter (Signed)
I agree with Fabian Sharp, PA-C.  Mikle Bosworth.D.

## 2018-11-03 ENCOUNTER — Telehealth: Payer: Self-pay | Admitting: Internal Medicine

## 2018-11-03 ENCOUNTER — Other Ambulatory Visit: Payer: Self-pay

## 2018-11-03 ENCOUNTER — Telehealth (INDEPENDENT_AMBULATORY_CARE_PROVIDER_SITE_OTHER): Payer: BC Managed Care – PPO | Admitting: Internal Medicine

## 2018-11-03 DIAGNOSIS — I4892 Unspecified atrial flutter: Secondary | ICD-10-CM | POA: Diagnosis not present

## 2018-11-03 NOTE — Progress Notes (Signed)
Electrophysiology TeleHealth Note   Due to national recommendations of social distancing due to COVID 19, an audio/video telehealth visit is felt to be most appropriate for this patient at this time.  See MyChart message from today for the patient's consent to telehealth for [name].   Date:  11/03/2018   ID:  Kayla Jacobs, DOB Apr 14, 1956, MRN [id number]  Location: patient's home  Provider location: [address] [place]  Evaluation Performed: Follow-up visit  PCP:  Jonathon Jordan, MD  Cardiologist:  No primary care provider on file.  Electrophysiologist:  Dr Lovena Le  Chief Complaint:  "My heart has been racing some"  History of Present Illness:    Kayla Jacobs is a 63 y.o. female who presents via audio/video conferencing for a telehealth visit today. She is a pleasant 63 yo woman with atrial flutter and a RVR. She was in the hospital several months ago and converted back to NSR. She had a h/o modest ETOH and caffeine use and has moderated these. She had done well until a few days ago when she noted her fit bit showing a regular tachy at 140. She has uptitrated her dose of cardizem. Her HR has been in the 90-100 range since then.  Today, she denies symptoms of chest pain, shortness of breath,  lower extremity edema, dizziness, presyncope, or syncope.  The patient is otherwise without complaint today except for the palpitations.  The patient denies symptoms of fevers, chills, cough, or new SOB worrisome for COVID 19.  Past Medical History:  Diagnosis Date   Cancer ([place])    basal cell skin cancer   Osteopenia     Past Surgical History:  Procedure Laterality Date   CESAREAN SECTION  1988   COLONOSCOPY     LUMBAR [place] SURGERY  2005   POLYPECTOMY     TONSILLECTOMY  1962    Current Outpatient Medications  Medication Sig Dispense Refill   apixaban (ELIQUIS) 5 MG TABS tablet Take 1 tablet (5 mg total) by mouth 2 (two) times daily. 180 tablet 3    azithromycin (ZITHROMAX) 250 MG tablet Take 1 tablet by mouth daily.     Cholecalciferol (VITAMIN D3) 1000 units CHEW Chew 2,000 Units by mouth daily.      diltiazem (CARDIZEM CD) 240 MG 24 hr capsule Take 1 capsule (240 mg total) by mouth daily. 90 capsule 3   NON FORMULARY Take 1 tablet by mouth See admin instructions. Vitafusion Gummy Vitamins for Women- Chew 1 gummie by mouth once a day      vitamin C (ASCORBIC ACID) 500 MG tablet Take 1,000 mg by mouth daily.     No current facility-administered medications for this visit.     Allergies:   Patient has no known allergies.   Social History:  The patient  reports that she has never smoked. She has never used smokeless tobacco. She reports current alcohol use of about 7.0 standard drinks of alcohol per week. She reports that she does not use drugs.   Family History:  The patient's  family history includes Esophageal cancer in her father.   ROS:  Please see the history of present illness.   All other systems are personally reviewed and negative.    Exam:    Vital Signs:  P - 95   Labs/Other Tests and Data Reviewed:    Recent Labs: 08/25/2018: ALT 24; Magnesium 1.7; TSH 4.332 08/26/2018: BUN 13; Creatinine, Ser 0.88; Hemoglobin 12.5; Platelets 171;  Potassium 3.6; Sodium 139   Wt Readings from Last 3 Encounters:  09/07/18 106 lb 12.8 oz (48.4 kg)  08/26/18 105 lb 4.8 oz (47.8 kg)  11/08/17 108 lb (49 kg)     Other studies personally reviewed: Additional studies/ records that were reviewed today include:   n/a   ASSESSMENT & PLAN:    1.  Atrial flutter - it appears she is having a recurrence. I have discussed the treatment options with the patient. I have recommended she continue her meds with plans for an ablation in 3-4 weeks. If she reverts back to NSR, then we will hold off on the ablation and see her back in the office. 2. COVID 19 screen The patient denies symptoms of COVID 19 at this time.  The importance of social  distancing was discussed today.  Follow-up:  As above Next remote: n/a  Current medicines are reviewed at length with the patient today.   The patient does not have concerns regarding her medicines.  The following changes were made today:  none  Labs/ tests ordered today include:  No orders of the defined types were placed in this encounter.    Patient Risk:  after full review of this patients clinical status, I feel that they are at moderate risk at this time.  Today, I have spent 15 minutes with the patient with telehealth technology discussing all of the above.    Signed, Cristopher Peru, MD  11/03/2018 2:32 PM     [place] [address] [place] [address] [phone] (office) [phone] (fax)

## 2018-11-03 NOTE — Telephone Encounter (Signed)
appt is complete

## 2018-11-03 NOTE — Telephone Encounter (Signed)
New Message:    Patient calling concering appt. Please call.

## 2019-01-01 ENCOUNTER — Telehealth: Payer: Self-pay | Admitting: Internal Medicine

## 2019-01-01 NOTE — Telephone Encounter (Signed)
New Message      Called and left message to confirm appt and answer COVID Questions

## 2019-01-01 NOTE — Telephone Encounter (Signed)
Patient returned your call.

## 2019-01-01 NOTE — Telephone Encounter (Signed)

## 2019-01-02 ENCOUNTER — Encounter: Payer: Self-pay | Admitting: Internal Medicine

## 2019-01-02 ENCOUNTER — Ambulatory Visit: Payer: BC Managed Care – PPO | Admitting: Internal Medicine

## 2019-01-02 ENCOUNTER — Other Ambulatory Visit: Payer: Self-pay

## 2019-01-02 ENCOUNTER — Ambulatory Visit (INDEPENDENT_AMBULATORY_CARE_PROVIDER_SITE_OTHER): Payer: BC Managed Care – PPO | Admitting: Internal Medicine

## 2019-01-02 VITALS — BP 104/72 | HR 61 | Ht 66.0 in | Wt 107.0 lb

## 2019-01-02 DIAGNOSIS — I4892 Unspecified atrial flutter: Secondary | ICD-10-CM | POA: Diagnosis not present

## 2019-01-02 MED ORDER — ASPIRIN EC 81 MG PO TBEC: 81.0000 mg | DELAYED_RELEASE_TABLET | Freq: Every day | ORAL | 3 refills | Status: DC

## 2019-01-02 NOTE — Patient Instructions (Addendum)
Medication Instructions:  Your physician has recommended you make the following change in your medication:   1.  Stop taking Eliquis 2.  Start taking Aspirin 81 mg one tablet by mouth daily   Labwork: None ordered.  Testing/Procedures: None ordered.  Follow-Up: Your physician wants you to follow-up in: 6 months with Dr. Lovena Le.   You will receive a reminder letter in the mail two months in advance. If you don't receive a letter, please call our office to schedule the follow-up appointment.  Any Other Special Instructions Will Be Listed Below (If Applicable).  If you need a refill on your cardiac medications before your next appointment, please call your pharmacy.

## 2019-01-02 NOTE — Progress Notes (Signed)
HPI Kayla Jacobs returns today for followup of her atrial flutter. She is a pleasant otherwise healthy woman who has a h/o atrial flutter in the past with spontaneous resolution. She had more flutter a couple of months ago and used additional cardizem to control her symptoms and the palpitations resolved. She has done well in the interim. She would like to stop her systemic anti-coagulation. Her CHADSVASC2 score is 1.  No Known Allergies   Current Outpatient Medications  Medication Sig Dispense Refill   Cholecalciferol (VITAMIN D3) 1000 units CHEW Chew 2,000 Units by mouth daily.      diltiazem (TIAZAC) 120 MG 24 hr capsule Take 1 capsule by mouth daily.     NON FORMULARY Take 1 tablet by mouth See admin instructions. Vitafusion Gummy Vitamins for Women- Chew 1 gummie by mouth once a day      vitamin C (ASCORBIC ACID) 500 MG tablet Take 1,000 mg by mouth daily.     aspirin EC 81 MG tablet Take 1 tablet (81 mg total) by mouth daily. 90 tablet 3   No current facility-administered medications for this visit.      Past Medical History:  Diagnosis Date   Cancer ([place])    basal cell skin cancer   Osteopenia     ROS:   All systems reviewed and negative except as noted in the HPI.   Past Surgical History:  Procedure Laterality Date   [place] SURGERY  2005   POLYPECTOMY     TONSILLECTOMY  1962     Family History  Problem Relation Age of Onset   Esophageal cancer Father    Colon cancer Neg Hx    Stomach cancer Neg Hx    Colon polyps Neg Hx    Rectal cancer Neg Hx      Social History   Socioeconomic History   Marital status: Married    Spouse name: Not on file   Number of children: Not on file   Years of education: Not on file   Highest education level: Not on file  Occupational History   Not on file  Social Needs   Financial resource strain: Not on file   Food insecurity    Worry: Not on  file    Inability: Not on file   Transportation needs    Medical: Not on file    Non-medical: Not on file  Tobacco Use   Smoking status: Never Smoker   Smokeless tobacco: Never Used  Substance and Sexual Activity   Alcohol use: Yes    Alcohol/week: 7.0 standard drinks    Types: 7 Glasses of wine per week   Drug use: No   Sexual activity: Not on file  Lifestyle   Physical activity    Days per week: Not on file    Minutes per session: Not on file   Stress: Not on file  Relationships   Social connections    Talks on phone: Not on file    Gets together: Not on file    Attends religious service: Not on file    Active member of club or organization: Not on file    Attends meetings of clubs or organizations: Not on file    Relationship status: Not on file   Intimate partner violence    Fear of current or ex partner: Not on file    Emotionally abused: Not on file  Physically abused: Not on file    Forced sexual activity: Not on file  Other Topics Concern   Not on file  Social History Narrative   Not on file     BP 104/72    Pulse 61    Ht 5\' 6"  (1.676 m)    Wt 107 lb (48.5 kg)    SpO2 98%    BMI 17.27 kg/m   Physical Exam:  Well appearing NAD HEENT: Unremarkable Neck:  No JVD, no thyromegally Lymphatics:  No adenopathy Back:  No CVA tenderness Lungs:  Clear with no wheezes HEART:  Regular rate rhythm, no murmurs, no rubs, no clicks Abd:  soft, positive bowel sounds, no organomegally, no rebound, no guarding Ext:  2 plus pulses, no edema, no cyanosis, no clubbing Skin:  No rashes no nodules Neuro:  CN II through XII intact, motor grossly intact  EKG - nsr  Assess/Plan: 1. Atrial flutter - she is maintaining NSR. She would like to pursue a strategy of watchful waiting and hold off on catheter ablation. She will stop her Eliquis and take low dose ASA. She will continue cardizem.  Mikle Bosworth.D.

## 2019-02-26 DIAGNOSIS — Z1231 Encounter for screening mammogram for malignant neoplasm of breast: Secondary | ICD-10-CM | POA: Diagnosis not present

## 2019-03-13 DIAGNOSIS — I4892 Unspecified atrial flutter: Secondary | ICD-10-CM

## 2019-04-03 ENCOUNTER — Other Ambulatory Visit (HOSPITAL_COMMUNITY)
Admission: RE | Admit: 2019-04-03 | Discharge: 2019-04-03 | Disposition: A | Discharge status: Home / Self Care | Payer: BC Managed Care – PPO | Source: Ambulatory Visit | Attending: Internal Medicine | Admitting: Internal Medicine

## 2019-04-03 ENCOUNTER — Other Ambulatory Visit: Payer: BC Managed Care – PPO

## 2019-04-03 ENCOUNTER — Other Ambulatory Visit: Payer: Self-pay

## 2019-04-03 DIAGNOSIS — I4892 Unspecified atrial flutter: Secondary | ICD-10-CM

## 2019-04-03 DIAGNOSIS — Z20828 Contact with and (suspected) exposure to other viral communicable diseases: Secondary | ICD-10-CM | POA: Diagnosis not present

## 2019-04-03 DIAGNOSIS — Z01812 Encounter for preprocedural laboratory examination: Secondary | ICD-10-CM | POA: Diagnosis present

## 2019-04-03 LAB — CBC WITH DIFFERENTIAL/PLATELET
Basophils Absolute: 0.1 10*3/uL (ref 0.0–0.2)
Basos: 1 %
EOS (ABSOLUTE): 0.1 10*3/uL (ref 0.0–0.4)
Eos: 2 %
Hematocrit: 38.8 % (ref 34.0–46.6)
Hemoglobin: 13.2 g/dL (ref 11.1–15.9)
Immature Grans (Abs): 0 10*3/uL (ref 0.0–0.1)
Immature Granulocytes: 0 %
Lymphocytes Absolute: 1.9 10*3/uL (ref 0.7–3.1)
Lymphs: 45 %
MCH: 33 pg (ref 26.6–33.0)
MCHC: 34 g/dL (ref 31.5–35.7)
MCV: 97 fL (ref 79–97)
Monocytes Absolute: 0.5 10*3/uL (ref 0.1–0.9)
Monocytes: 12 %
Neutrophils Absolute: 1.7 10*3/uL (ref 1.4–7.0)
Neutrophils: 40 %
Platelets: 168 10*3/uL (ref 150–450)
RBC: 4 x10E6/uL (ref 3.77–5.28)
RDW: 12 % (ref 11.7–15.4)
WBC: 4.2 10*3/uL (ref 3.4–10.8)

## 2019-04-03 LAB — BASIC METABOLIC PANEL
BUN/Creatinine Ratio: 14 (ref 12–28)
BUN: 13 mg/dL (ref 8–27)
CO2: 25 mmol/L (ref 20–29)
Calcium: 9.5 mg/dL (ref 8.7–10.3)
Chloride: 103 mmol/L (ref 96–106)
Creatinine, Ser: 0.9 mg/dL (ref 0.57–1.00)
GFR calc Af Amer: 79 mL/min/{1.73_m2} (ref >59)
GFR calc non Af Amer: 69 mL/min/{1.73_m2} (ref >59)
Glucose: 65 mg/dL (ref 65–99)
Potassium: 4.5 mmol/L (ref 3.5–5.2)
Sodium: 142 mmol/L (ref 134–144)

## 2019-04-04 LAB — NOVEL CORONAVIRUS, NAA (HOSP ORDER, SEND-OUT TO REF LAB; TAT 18-24 HRS): SARS-CoV-2, NAA: NOT DETECTED

## 2019-04-06 ENCOUNTER — Ambulatory Visit (HOSPITAL_COMMUNITY)
Admission: RE | Admit: 2019-04-06 | Discharge: 2019-04-06 | Disposition: A | Discharge status: Home / Self Care | Payer: BC Managed Care – PPO | Attending: Internal Medicine | Admitting: Internal Medicine

## 2019-04-06 ENCOUNTER — Other Ambulatory Visit: Payer: Self-pay

## 2019-04-06 ENCOUNTER — Ambulatory Visit (HOSPITAL_COMMUNITY)
Admission: RE | Disposition: A | Discharge status: Home / Self Care | Payer: Self-pay | Source: Home / Self Care | Attending: Internal Medicine

## 2019-04-06 DIAGNOSIS — I483 Typical atrial flutter: Secondary | ICD-10-CM | POA: Diagnosis not present

## 2019-04-06 DIAGNOSIS — Z7982 Long term (current) use of aspirin: Secondary | ICD-10-CM | POA: Diagnosis not present

## 2019-04-06 DIAGNOSIS — I4892 Unspecified atrial flutter: Secondary | ICD-10-CM | POA: Insufficient documentation

## 2019-04-06 DIAGNOSIS — M8580 Other specified disorders of bone density and structure, unspecified site: Secondary | ICD-10-CM | POA: Insufficient documentation

## 2019-04-06 DIAGNOSIS — Z79899 Other long term (current) drug therapy: Secondary | ICD-10-CM | POA: Diagnosis not present

## 2019-04-06 DIAGNOSIS — I4891 Unspecified atrial fibrillation: Secondary | ICD-10-CM | POA: Diagnosis not present

## 2019-04-06 HISTORY — PX: A-FLUTTER ABLATION: EP1230

## 2019-04-06 SURGERY — A-FLUTTER ABLATION
Anesthesia: LOCAL

## 2019-04-06 MED ORDER — FENTANYL CITRATE (PF) 100 MCG/2ML IJ SOLN
INTRAMUSCULAR | Status: DC | PRN
  Administered 2019-04-06 (×8): 12.5 ug via INTRAVENOUS
  Administered 2019-04-06: 25 ug via INTRAVENOUS
  Administered 2019-04-06 (×2): 12.5 ug via INTRAVENOUS

## 2019-04-06 MED ORDER — MIDAZOLAM HCL 5 MG/5ML IJ SOLN
INTRAMUSCULAR | Status: AC
  Filled 2019-04-06: qty 5

## 2019-04-06 MED ORDER — ONDANSETRON HCL 4 MG/2ML IJ SOLN: 4.0000 mg | Freq: Four times a day (QID) | INTRAMUSCULAR | Status: DC | PRN

## 2019-04-06 MED ORDER — SODIUM CHLORIDE 0.9 % IV SOLN
INTRAVENOUS | Status: DC
  Administered 2019-04-06: 06:00:00 via INTRAVENOUS

## 2019-04-06 MED ORDER — BUPIVACAINE HCL (PF) 0.25 % IJ SOLN
INTRAMUSCULAR | Status: DC | PRN
  Administered 2019-04-06: 45 mL

## 2019-04-06 MED ORDER — FENTANYL CITRATE (PF) 100 MCG/2ML IJ SOLN
INTRAMUSCULAR | Status: AC
  Filled 2019-04-06: qty 2

## 2019-04-06 MED ORDER — FLECAINIDE ACETATE 50 MG PO TABS: 50.0000 mg | ORAL_TABLET | Freq: Two times a day (BID) | ORAL | 1 refills | Status: DC

## 2019-04-06 MED ORDER — METOPROLOL TARTRATE 5 MG/5ML IV SOLN
INTRAVENOUS | Status: AC
  Filled 2019-04-06: qty 5

## 2019-04-06 MED ORDER — MIDAZOLAM HCL 5 MG/5ML IJ SOLN
INTRAMUSCULAR | Status: DC | PRN
  Administered 2019-04-06 (×12): 1 mg via INTRAVENOUS

## 2019-04-06 MED ORDER — IBUTILIDE FUMARATE 1 MG/10ML IV SOLN
INTRAVENOUS | Status: AC | PRN
  Administered 2019-04-06: 1 mg via INTRAVENOUS

## 2019-04-06 MED ORDER — SODIUM CHLORIDE 0.9% FLUSH: 3.0000 mL | INTRAVENOUS | Status: DC | PRN

## 2019-04-06 MED ORDER — BUPIVACAINE HCL (PF) 0.25 % IJ SOLN
INTRAMUSCULAR | Status: AC
  Filled 2019-04-06: qty 60

## 2019-04-06 MED ORDER — SODIUM CHLORIDE 0.9% FLUSH: 3.0000 mL | Freq: Two times a day (BID) | INTRAVENOUS | Status: DC

## 2019-04-06 MED ORDER — HEPARIN SODIUM (PORCINE) 1000 UNIT/ML IJ SOLN
INTRAMUSCULAR | Status: AC
  Filled 2019-04-06: qty 1

## 2019-04-06 MED ORDER — MAGNESIUM SULFATE 50 % IJ SOLN
INTRAMUSCULAR | Status: AC
  Filled 2019-04-06: qty 2

## 2019-04-06 MED ORDER — MAGNESIUM SULFATE 50 % IJ SOLN
INTRAMUSCULAR | Status: DC | PRN
  Administered 2019-04-06: 1 g via INTRAVENOUS

## 2019-04-06 MED ORDER — HEPARIN (PORCINE) IN NACL 1000-0.9 UT/500ML-% IV SOLN
INTRAVENOUS | Status: DC | PRN
  Administered 2019-04-06: 500 mL

## 2019-04-06 MED ORDER — SODIUM CHLORIDE 0.9 % IV SOLN: 250.0000 mL | INTRAVENOUS | Status: DC | PRN

## 2019-04-06 MED ORDER — IBUTILIDE FUMARATE 1 MG/10ML IV SOLN
INTRAVENOUS | Status: AC
  Filled 2019-04-06: qty 10

## 2019-04-06 MED ORDER — ACETAMINOPHEN 325 MG PO TABS: 650.0000 mg | ORAL_TABLET | ORAL | Status: DC | PRN

## 2019-04-06 MED ORDER — METOPROLOL TARTRATE 5 MG/5ML IV SOLN
INTRAVENOUS | Status: DC | PRN
  Administered 2019-04-06: 5 mg via INTRAVENOUS

## 2019-04-06 SURGICAL SUPPLY — 13 items
BAG SNAP BAND KOVER 36X36 (MISCELLANEOUS) ×1 IMPLANT
CATH HEX JOS 2-5-2 65CM 6F REP (CATHETERS) ×1 IMPLANT
CATH JOSEPH QUAD ALLRED 6F REP (CATHETERS) ×1 IMPLANT
CATH SMTCH THERMOCOOL SF FJ (CATHETERS) ×1 IMPLANT
CATH WEBSTER BI DIR CS D-F CRV (CATHETERS) ×1 IMPLANT
PACK EP LATEX FREE (CUSTOM PROCEDURE TRAY) ×1
PACK EP LF (CUSTOM PROCEDURE TRAY) ×1 IMPLANT
PAD PRO RADIOLUCENT 2001M-C (PAD) ×2 IMPLANT
PATCH CARTO3 (PAD) ×1 IMPLANT
SHEATH PINNACLE 6F 10CM (SHEATH) ×1 IMPLANT
SHEATH PINNACLE 7F 10CM (SHEATH) ×2 IMPLANT
SHEATH PINNACLE 8F 10CM (SHEATH) ×1 IMPLANT
TUBING SMART ABLATE COOLFLOW (TUBING) ×1 IMPLANT

## 2019-04-06 NOTE — Progress Notes (Signed)
71fr sheath aspirated and removed from right IJ manual pressure applied for 10 minutes. Site level 0, petroleum gauze and tegaderm dressing applied.  6,7,12fr sheaths aspirated and removed from right femoral artery. Manual pressure applied for 20 minutes. Site level 0 no S+S of hematoma. Tegaderm dressing applied , bedrest instructions given.   Bilateral dp and pt pulses palpable..  Bedrest begins at 11:35:00

## 2019-04-06 NOTE — Progress Notes (Signed)
Discharge instructions reviewed with pt husband voices understanding.  

## 2019-04-06 NOTE — Progress Notes (Signed)
Discharge instructions reviewed with pt. Attempted to call pt husband X2 mail box full and I can not leave a message.

## 2019-04-06 NOTE — H&P (Signed)
HPI Kayla Jacobs returns today for followup of her atrial flutter. She is a pleasant otherwise healthy woman who has a h/o atrial flutter in the past with spontaneous resolution. She had more flutter a couple of months ago and used additional cardizem to control her symptoms and the palpitations resolved. She has done well in the interim. She would like to stop her systemic anti-coagulation. Her CHADSVASC2 score is 1.  No Known Allergies         Current Outpatient Medications  Medication Sig Dispense Refill   Cholecalciferol (VITAMIN D3) 1000 units CHEW Chew 2,000 Units by mouth daily.      diltiazem (TIAZAC) 120 MG 24 hr capsule Take 1 capsule by mouth daily.     NON FORMULARY Take 1 tablet by mouth See admin instructions. Vitafusion Gummy Vitamins for Women- Chew 1 gummie by mouth once a day      vitamin C (ASCORBIC ACID) 500 MG tablet Take 1,000 mg by mouth daily.     aspirin EC 81 MG tablet Take 1 tablet (81 mg total) by mouth daily. 90 tablet 3   No current facility-administered medications for this visit.          Past Medical History:  Diagnosis Date   Cancer ([place])    basal cell skin cancer   Osteopenia     ROS:   All systems reviewed and negative except as noted in the HPI.        Past Surgical History:  Procedure Laterality Date   [place] SURGERY  2005   POLYPECTOMY     TONSILLECTOMY  1962          Family History  Problem Relation Age of Onset   Esophageal cancer Father    Colon cancer Neg Hx    Stomach cancer Neg Hx    Colon polyps Neg Hx    Rectal cancer Neg Hx      Social History        Socioeconomic History   Marital status: Married    Spouse name: Not on file   Number of children: Not on file   Years of education: Not on file   Highest education level: Not on file  Occupational History   Not on file  Social Needs   Financial  resource strain: Not on file   Food insecurity    Worry: Not on file    Inability: Not on file   Transportation needs    Medical: Not on file    Non-medical: Not on file  Tobacco Use   Smoking status: Never Smoker   Smokeless tobacco: Never Used  Substance and Sexual Activity   Alcohol use: Yes    Alcohol/week: 7.0 standard drinks    Types: 7 Glasses of wine per week   Drug use: No   Sexual activity: Not on file  Lifestyle   Physical activity    Days per week: Not on file    Minutes per session: Not on file   Stress: Not on file  Relationships   Social connections    Talks on phone: Not on file    Gets together: Not on file    Attends religious service: Not on file    Active member of club or organization: Not on file    Attends meetings of clubs or organizations: Not on file    Relationship status: Not on file   Intimate partner violence  Fear of current or ex partner: Not on file    Emotionally abused: Not on file    Physically abused: Not on file    Forced sexual activity: Not on file  Other Topics Concern   Not on file  Social History Narrative   Not on file     BP 104/72    Pulse 61    Ht 5\' 6"  (1.676 m)    Wt 107 lb (48.5 kg)    SpO2 98%    BMI 17.27 kg/m   Physical Exam:  Well appearing NAD HEENT: Unremarkable Neck:  No JVD, no thyromegally Lymphatics:  No adenopathy Back:  No CVA tenderness Lungs:  Clear with no wheezes HEART:  Regular rate rhythm, no murmurs, no rubs, no clicks Abd:  soft, positive bowel sounds, no organomegally, no rebound, no guarding Ext:  2 plus pulses, no edema, no cyanosis, no clubbing Skin:  No rashes no nodules Neuro:  CN II through XII intact, motor grossly intact  EKG - nsr  Assess/Plan: 1. Atrial flutter - she is maintaining NSR. She would like to pursue a strategy of watchful waiting and hold off on catheter ablation. She will stop her Eliquis and take low dose  ASA. She will continue cardizem.  Ponciano Ort.  EP Attending  Patient seen and examined. No change since her last visit. We will proceed with EP study and catheter ablation of atrial flutter. I have discussed the risks/benefits/goals/expectations of the procedure and she wishes to proceed.  Mikle Bosworth.D.

## 2019-04-06 NOTE — Discharge Instructions (Signed)

## 2019-04-09 ENCOUNTER — Encounter (HOSPITAL_COMMUNITY): Payer: Self-pay | Admitting: Internal Medicine

## 2019-04-09 MED FILL — Heparin Sodium (Porcine) Inj 1000 Unit/ML: INTRAMUSCULAR | Qty: 10 | Status: AC

## 2019-05-04 ENCOUNTER — Ambulatory Visit (INDEPENDENT_AMBULATORY_CARE_PROVIDER_SITE_OTHER): Payer: BC Managed Care – PPO | Admitting: Internal Medicine

## 2019-05-04 ENCOUNTER — Encounter: Payer: Self-pay | Admitting: Internal Medicine

## 2019-05-04 ENCOUNTER — Other Ambulatory Visit: Payer: Self-pay

## 2019-05-04 VITALS — BP 114/66 | HR 61 | Ht 66.0 in | Wt 110.4 lb

## 2019-05-04 DIAGNOSIS — I4892 Unspecified atrial flutter: Secondary | ICD-10-CM

## 2019-05-04 NOTE — Progress Notes (Signed)
HPI [name] returns today for followup. She is a pleasant 63 yo woman with atrial flutter s/p EPS/RFA. She was noted to have some transient atrial fib during her procedure I was concerned about this and started her on low dose flecainide. She has done well in the interim. No palpitations. No Known Allergies   Current Outpatient Medications  Medication Sig Dispense Refill  . Ascorbic Acid (VITAMIN C) 500 MG CHEW Chew 500 mg by mouth daily.    Marland Kitchen aspirin EC 81 MG tablet Take 1 tablet (81 mg total) by mouth daily. (Patient taking differently: Take 81 mg by mouth at bedtime. ) 90 tablet 3  . Calcium Carbonate-Vitamin D (CALCIUM-D PO) Take 1 tablet by mouth once a week.    . diltiazem (TIAZAC) 120 MG 24 hr capsule Take 120 mg by mouth daily.     . flecainide (TAMBOCOR) 50 MG tablet Take 1 tablet (50 mg total) by mouth 2 (two) times daily. 60 tablet 1  . Multiple Vitamins-Minerals (ADULT GUMMY PO) Take 2 tablets by mouth daily. Vitafusion Gummy Vitamins for Women-     No current facility-administered medications for this visit.      Past Medical History:  Diagnosis Date  . Cancer ([place])    basal cell skin cancer  . Osteopenia     ROS:   All systems reviewed and negative except as noted in the HPI.   Past Surgical History:  Procedure Laterality Date  . A-FLUTTER ABLATION N/A 04/06/2019   Procedure: A-FLUTTER ABLATION;  Surgeon: Evans Lance, MD;  Location: [place] [facility];  Service: Cardiovascular;  Laterality: N/A;  . [place]  . COLONOSCOPY    . [place] SURGERY  2005  . POLYPECTOMY    . TONSILLECTOMY  1962     Family History  Problem Relation Age of Onset  . Esophageal cancer Father   . Colon cancer Neg Hx   . Stomach cancer Neg Hx   . Colon polyps Neg Hx   . Rectal cancer Neg Hx      Social History   Socioeconomic History  . Marital status: Married    Spouse name: Not on file  . Number of children: Not on file  . Years of  education: Not on file  . Highest education level: Not on file  Occupational History  . Not on file  Social Needs  . Financial resource strain: Not on file  . Food insecurity    Worry: Not on file    Inability: Not on file  . Transportation needs    Medical: Not on file    Non-medical: Not on file  Tobacco Use  . Smoking status: Never Smoker  . Smokeless tobacco: Never Used  Substance and Sexual Activity  . Alcohol use: Yes    Alcohol/week: 7.0 standard drinks    Types: 7 Glasses of wine per week  . Drug use: No  . Sexual activity: Not on file  Lifestyle  . Physical activity    Days per week: Not on file    Minutes per session: Not on file  . Stress: Not on file  Relationships  . Social Herbalist on phone: Not on file    Gets together: Not on file    Attends religious service: Not on file    Active member of club or organization: Not on file    Attends meetings of clubs or organizations: Not on file  Relationship status: Not on file  . Intimate partner violence    Fear of current or ex partner: Not on file    Emotionally abused: Not on file    Physically abused: Not on file    Forced sexual activity: Not on file  Other Topics Concern  . Not on file  Social History Narrative  . Not on file     BP 114/66   Pulse 61   Ht 5\' 6"  (1.676 m)   Wt 110 lb 6.4 oz (50.1 kg)   SpO2 99%   BMI 17.82 kg/m   Physical Exam:  Well appearing NAD HEENT: Unremarkable Neck:  No JVD, no thyromegally Lymphatics:  No adenopathy Back:  No CVA tenderness Lungs:  Clear with no wheezes HEART:  Regular rate rhythm, no murmurs, no rubs, no clicks Abd:  soft, positive bowel sounds, no organomegally, no rebound, no guarding Ext:  2 plus pulses, no edema, no cyanosis, no clubbing Skin:  No rashes no nodules Neuro:  CN II through XII intact, motor grossly intact  EKG - nsr  Assess/Plan: 1. Atrial flutter - she is doing well, s/p EPS/RFA. She will continue her low dose  flecainide and wean off of the cardizem. 2. PAF - she had this only during the EP study. She will remain on low dose flecainide for 3-4 months. I plan to wean her off of this at that time if she has had no symptomatic arrhythmias.  Mikle Bosworth.D.

## 2019-05-04 NOTE — Patient Instructions (Addendum)
Medication Instructions:  Your physician recommends that you continue on your current medications as directed. Please refer to the Current Medication list given to you today.  1.  You will wean off your diltiazem-Take one capsule every other day for 2 weeks and then stop  Labwork: None ordered.  Testing/Procedures: None ordered.  Follow-Up: Your physician wants you to follow-up in: 4 months with Dr. Lovena Le.   You will receive a reminder letter in the mail two months in advance. If you don't receive a letter, please call our office to schedule the follow-up appointment.   Any Other Special Instructions Will Be Listed Below (If Applicable).  If you need a refill on your cardiac medications before your next appointment, please call your pharmacy.

## 2019-05-08 MED ORDER — DILTIAZEM HCL ER COATED BEADS 120 MG PO CP24: 120.0000 mg | ORAL_CAPSULE | Freq: Two times a day (BID) | ORAL | 3 refills | Status: DC

## 2019-05-22 DIAGNOSIS — Z20828 Contact with and (suspected) exposure to other viral communicable diseases: Secondary | ICD-10-CM | POA: Diagnosis not present

## 2019-05-22 DIAGNOSIS — J019 Acute sinusitis, unspecified: Secondary | ICD-10-CM | POA: Diagnosis not present

## 2019-05-28 DIAGNOSIS — Z20828 Contact with and (suspected) exposure to other viral communicable diseases: Secondary | ICD-10-CM | POA: Diagnosis not present

## 2019-06-04 ENCOUNTER — Other Ambulatory Visit: Payer: Self-pay | Admitting: Physician Assistant

## 2019-06-09 ENCOUNTER — Other Ambulatory Visit: Payer: Self-pay | Admitting: Physician Assistant

## 2019-06-12 MED ORDER — FLECAINIDE ACETATE 50 MG PO TABS: 50.0000 mg | ORAL_TABLET | Freq: Two times a day (BID) | ORAL | 3 refills | Status: DC

## 2019-06-15 ENCOUNTER — Other Ambulatory Visit: Payer: Self-pay | Admitting: Otolaryngology

## 2019-06-15 DIAGNOSIS — J329 Chronic sinusitis, unspecified: Secondary | ICD-10-CM

## 2019-06-20 ENCOUNTER — Other Ambulatory Visit: Payer: BC Managed Care – PPO

## 2019-06-25 ENCOUNTER — Ambulatory Visit
Admission: RE | Admit: 2019-06-25 | Discharge: 2019-06-25 | Disposition: A | Discharge status: Home / Self Care | Payer: BC Managed Care – PPO | Source: Ambulatory Visit | Attending: Otolaryngology | Admitting: Otolaryngology

## 2019-06-25 DIAGNOSIS — J329 Chronic sinusitis, unspecified: Secondary | ICD-10-CM

## 2019-07-11 DIAGNOSIS — J310 Chronic rhinitis: Secondary | ICD-10-CM | POA: Diagnosis not present

## 2019-07-11 DIAGNOSIS — J342 Deviated nasal septum: Secondary | ICD-10-CM | POA: Diagnosis not present

## 2019-07-11 DIAGNOSIS — J343 Hypertrophy of nasal turbinates: Secondary | ICD-10-CM | POA: Diagnosis not present

## 2019-08-06 ENCOUNTER — Other Ambulatory Visit (HOSPITAL_COMMUNITY)
Admission: RE | Admit: 2019-08-06 | Discharge: 2019-08-06 | Disposition: A | Discharge status: Home / Self Care | Payer: BC Managed Care – PPO | Source: Ambulatory Visit | Attending: Family Medicine | Admitting: Family Medicine

## 2019-08-06 DIAGNOSIS — Z01411 Encounter for gynecological examination (general) (routine) with abnormal findings: Secondary | ICD-10-CM | POA: Insufficient documentation

## 2019-08-06 DIAGNOSIS — M8580 Other specified disorders of bone density and structure, unspecified site: Secondary | ICD-10-CM | POA: Diagnosis not present

## 2019-08-06 DIAGNOSIS — Z0000 Encounter for general adult medical examination without abnormal findings: Secondary | ICD-10-CM | POA: Diagnosis not present

## 2019-08-06 DIAGNOSIS — I4892 Unspecified atrial flutter: Secondary | ICD-10-CM | POA: Diagnosis not present

## 2019-08-06 DIAGNOSIS — Z79899 Other long term (current) drug therapy: Secondary | ICD-10-CM | POA: Diagnosis not present

## 2019-08-08 LAB — CYTOLOGY - PAP
Comment: NEGATIVE
Diagnosis: NEGATIVE
High risk HPV: NEGATIVE

## 2019-08-27 ENCOUNTER — Ambulatory Visit: Payer: BC Managed Care – PPO | Admitting: Internal Medicine

## 2019-08-28 ENCOUNTER — Other Ambulatory Visit: Payer: Self-pay

## 2019-08-28 ENCOUNTER — Encounter: Payer: Self-pay | Admitting: Internal Medicine

## 2019-08-28 ENCOUNTER — Ambulatory Visit: Payer: BC Managed Care – PPO | Admitting: Internal Medicine

## 2019-08-28 VITALS — BP 132/78 | HR 60 | Ht 66.0 in | Wt 111.0 lb

## 2019-08-28 DIAGNOSIS — I4892 Unspecified atrial flutter: Secondary | ICD-10-CM | POA: Diagnosis not present

## 2019-08-28 MED ORDER — FLECAINIDE ACETATE 50 MG PO TABS: ORAL_TABLET | ORAL | 3 refills | Status: DC

## 2019-08-28 NOTE — Progress Notes (Signed)
HPI Kayla Jacobs returns today for followup. She has a h/o atrial flutter, s/p ablation where she was found to have atrial fib during the procedure. She was treated with prednisone and probably went into atrial fib late in the fall. Her rates were in the 90-100 range and she did not feel bad. She has a CHADSVASC score of 1 (female). She has not had more palpitations. When she was in atrial flutter she felt much worse with rapid rates. She has 1-2 drinks a day. She has cut out caffeine. No edema.  No Known Allergies   Current Outpatient Medications  Medication Sig Dispense Refill  . Ascorbic Acid (VITAMIN C) 500 MG CHEW Chew 500 mg by mouth daily.    Marland Kitchen aspirin EC 81 MG tablet Take 1 tablet (81 mg total) by mouth daily. (Patient taking differently: Take 81 mg by mouth at bedtime. ) 90 tablet 3  . Calcium Carbonate-Vitamin D (CALCIUM-D PO) Take 1 tablet by mouth once a week.    . Multiple Vitamins-Minerals (ADULT GUMMY PO) Take 2 tablets by mouth daily. Vitafusion Gummy Vitamins for Women-    . flecainide (TAMBOCOR) 50 MG tablet Take one tablet by mouth twice a day.  May take one additional tablet by mouth daily for breakthrough palpitations. 270 tablet 3   No current facility-administered medications for this visit.     Past Medical History:  Diagnosis Date  . Cancer ([place])    basal cell skin cancer  . Osteopenia     ROS:   All systems reviewed and negative except as noted in the HPI.   Past Surgical History:  Procedure Laterality Date  . A-FLUTTER ABLATION N/A 04/06/2019   Procedure: A-FLUTTER ABLATION;  Surgeon: Evans Lance, MD;  Location: [facility];  Service: Cardiovascular;  Laterality: N/A;  . [place]  . COLONOSCOPY    . [place] SURGERY  2005  . POLYPECTOMY    . TONSILLECTOMY  1962     Family History  Problem Relation Age of Onset  . Esophageal cancer Father   . Colon cancer Neg Hx   . Stomach cancer Neg Hx   . Colon polyps Neg Hx    . Rectal cancer Neg Hx      Social History   Socioeconomic History  . Marital status: Married    Spouse name: Not on file  . Number of children: Not on file  . Years of education: Not on file  . Highest education level: Not on file  Occupational History  . Not on file  Tobacco Use  . Smoking status: Never Smoker  . Smokeless tobacco: Never Used  Substance and Sexual Activity  . Alcohol use: Yes    Alcohol/week: 7.0 standard drinks    Types: 7 Glasses of wine per week  . Drug use: No  . Sexual activity: Not on file  Other Topics Concern  . Not on file  Social History Narrative  . Not on file   Social Determinants of Health   Financial Resource Strain:   . Difficulty of Paying Living Expenses: Not on file  Food Insecurity:   . Worried About Charity fundraiser in the Last Year: Not on file  . Ran Out of Food in the Last Year: Not on file  Transportation Needs:   . Lack of Transportation (Medical): Not on file  . Lack of Transportation (Non-Medical): Not on file  Physical Activity:   . Days  of Exercise per Week: Not on file  . Minutes of Exercise per Session: Not on file  Stress:   . Feeling of Stress : Not on file  Social Connections:   . Frequency of Communication with Friends and Family: Not on file  . Frequency of Social Gatherings with Friends and Family: Not on file  . Attends Religious Services: Not on file  . Active Member of Clubs or Organizations: Not on file  . Attends Archivist Meetings: Not on file  . Marital Status: Not on file  Intimate Partner Violence:   . Fear of Current or Ex-Partner: Not on file  . Emotionally Abused: Not on file  . Physically Abused: Not on file  . Sexually Abused: Not on file     BP 132/78   Pulse 60   Ht 5\' 6"  (1.676 m)   Wt 111 lb (50.3 kg)   SpO2 98%   BMI 17.92 kg/m   Physical Exam:  Well appearing NAD HEENT: Unremarkable Neck:  No JVD, no thyromegally Lymphatics:  No adenopathy Back:  No CVA  tenderness Lungs:  Clear with no wheezes HEART:  Regular rate rhythm, no murmurs, no rubs, no clicks Abd:  soft, positive bowel sounds, no organomegally, no rebound, no guarding Ext:  2 plus pulses, no edema, no cyanosis, no clubbing Skin:  No rashes no nodules Neuro:  CN II through XII intact, motor grossly intact  EKG - NSR   Kardia device strips - looks like atrial fib with a controlled vR  Assess/Plan: 1. Atrial flutter - she appears to be maintaining NSR.  2. Atrial fib - she is on flecainide 50 bid. I have changed her prescription so that if she goes out of rhythm, she can take an additional 50 mg of flecainide.  3. HTN - she does not have this diagnosis but notes that her BP always goes up a bit when she goes to the doctor as she gets quite anxious.   Mikle Bosworth.D.

## 2019-08-28 NOTE — Patient Instructions (Addendum)
Medication Instructions:  Your physician recommends that you continue on your current medications as directed. Please refer to the Current Medication list given to you today.  You may take an additional flecainide 50 mg one tablet by mouth daily in addition to your scheduled flecainide for breakthrough palpitations.  Labwork: None ordered.  Testing/Procedures: None ordered.  Follow-Up: Your physician wants you to follow-up in: 6 months with Dr. Lovena Le.   You will receive a reminder letter in the mail two months in advance. If you don't receive a letter, please call our office to schedule the follow-up appointment.  Any Other Special Instructions Will Be Listed Below (If Applicable).  If you need a refill on your cardiac medications before your next appointment, please call your pharmacy.

## 2019-08-31 NOTE — Addendum Note (Signed)
Addended by: Rose Phi on: 08/31/2019 11:32 AM   Modules accepted: Orders

## 2019-09-05 DIAGNOSIS — H2513 Age-related nuclear cataract, bilateral: Secondary | ICD-10-CM | POA: Diagnosis not present

## 2019-11-14 DIAGNOSIS — D2261 Melanocytic nevi of right upper limb, including shoulder: Secondary | ICD-10-CM | POA: Diagnosis not present

## 2019-11-14 DIAGNOSIS — D2262 Melanocytic nevi of left upper limb, including shoulder: Secondary | ICD-10-CM | POA: Diagnosis not present

## 2019-11-14 DIAGNOSIS — D239 Other benign neoplasm of skin, unspecified: Secondary | ICD-10-CM | POA: Diagnosis not present

## 2019-11-14 DIAGNOSIS — L821 Other seborrheic keratosis: Secondary | ICD-10-CM | POA: Diagnosis not present

## 2020-02-13 DIAGNOSIS — H524 Presbyopia: Secondary | ICD-10-CM | POA: Diagnosis not present

## 2020-06-14 DIAGNOSIS — Z1231 Encounter for screening mammogram for malignant neoplasm of breast: Secondary | ICD-10-CM | POA: Diagnosis not present

## 2020-09-26 ENCOUNTER — Other Ambulatory Visit: Payer: Self-pay | Admitting: Internal Medicine

## 2020-11-27 ENCOUNTER — Other Ambulatory Visit: Payer: Self-pay

## 2020-11-27 ENCOUNTER — Telehealth: Payer: Self-pay | Admitting: Internal Medicine

## 2020-11-27 MED ORDER — FLECAINIDE ACETATE 50 MG PO TABS: ORAL_TABLET | ORAL | 0 refills | Status: DC

## 2020-11-27 NOTE — Telephone Encounter (Signed)
*  STAT* If patient is at the pharmacy, call can be transferred to refill team.   1. Which medications need to be refilled? (please list name of each medication and dose if known) flecainide (TAMBOCOR) 50 MG tablet  2. Which pharmacy/location (including street and city if local pharmacy) is medication to be sent to?[id number] - [place], [place] - [address] AT [address]  3. Do they need a 30 day or 90 day supply? 90 day supply    The pt is all out of medication

## 2020-12-24 ENCOUNTER — Telehealth: Payer: Self-pay | Admitting: Internal Medicine

## 2020-12-24 MED ORDER — DILTIAZEM HCL 30 MG PO TABS: 30.0000 mg | ORAL_TABLET | Freq: Four times a day (QID) | ORAL | 4 refills | Status: DC | PRN

## 2020-12-24 NOTE — Telephone Encounter (Signed)
  Pt c/o medication issue:  1. Name of Medication: diltiazem (CARDIZEM CD) 120 MG 24 hr   2. How are you currently taking this medication (dosage and times per day)? As needed. Pt took 240 mg one time and 120 mg another time  3. Are you having a reaction (difficulty breathing--STAT)? no  4. What is your medication issue? Patient wanted to know if it is still safe to take this medication as needed when she has tachycardia.   Patient had a spell ~10 days ago where her HR got up into the 200s, she took this medication and it came back down. The tachycardia came back again, and so the patient took more medication and it seemed to help. She is planning a trip to [place] and wants to make sure this is safe to do  If this is OK, she would like a refill sent to her pharmacy so she can have it on hand for her trip. The patient verified the pharmacy in the chart    Patient c/o Palpitations:  High priority if patient c/o lightheadedness, shortness of breath, or chest pain  How long have you had palpitations/irregular HR/ Afib? Are you having the symptoms now? no  Patient states this started again ~10 days ago  Are you currently experiencing lightheadedness, SOB or CP? no  Do you have a history of afib (atrial fibrillation) or irregular heart rhythm? Yes.   Have you checked your BP or HR? (document readings if available): yes using a Kardia device.  Are you experiencing any other symptoms? No

## 2020-12-24 NOTE — Telephone Encounter (Signed)
See My chart message

## 2021-01-27 ENCOUNTER — Encounter: Payer: Self-pay | Admitting: Internal Medicine

## 2021-01-27 ENCOUNTER — Ambulatory Visit: Payer: BC Managed Care – PPO | Admitting: Internal Medicine

## 2021-01-27 ENCOUNTER — Other Ambulatory Visit: Payer: Self-pay

## 2021-01-27 DIAGNOSIS — I480 Paroxysmal atrial fibrillation: Secondary | ICD-10-CM

## 2021-01-27 NOTE — Patient Instructions (Signed)

## 2021-01-27 NOTE — Progress Notes (Signed)
HPI Kayla Jacobs returns today for followup. She has a h/o atrial flutter, s/p ablation where she was found to have atrial fib during the procedure. She has done well on low dose flecainide. She denies chest pain or sob. No symptoms of atrial fib. No side effects on the flecainide. She has tried to travel to her native [place] but been locked down due to Covid.  No Known Allergies   Current Outpatient Medications  Medication Sig Dispense Refill   Ascorbic Acid (VITAMIN C) 500 MG CHEW Chew 500 mg by mouth daily.     Calcium Carbonate-Vitamin D (CALCIUM-D PO) Take 1 tablet by mouth once a week.     diltiazem (CARDIZEM) 30 MG tablet Take 1 tablet (30 mg total) by mouth 4 (four) times daily as needed (heart racing). 60 tablet 4   flecainide (TAMBOCOR) 50 MG tablet TAKE 1 TABLET BY MOUTH TWICE DAILY. MAY TAKE 1 ADDITIONAL TABLET DAILY FOR BREAKTHROUGH PALPITATIONS-SCHEDULE APPT FOR FUTURE REFILLS-1ST ATTEMPT 270 tablet 0   levothyroxine (SYNTHROID) 25 MCG tablet Take 12.5 mcg by mouth every morning.     Multiple Vitamins-Minerals (ADULT GUMMY PO) Take 2 tablets by mouth daily. Vitafusion Gummy Vitamins for Women-     aspirin EC 81 MG tablet Take 1 tablet (81 mg total) by mouth daily. (Patient not taking: Reported on 01/27/2021) 90 tablet 3   No current facility-administered medications for this visit.     Past Medical History:  Diagnosis Date   Cancer ([place])    basal cell skin cancer   Osteopenia     ROS:   All systems reviewed and negative except as noted in the HPI.   Past Surgical History:  Procedure Laterality Date   A-FLUTTER ABLATION N/A 04/06/2019   Procedure: A-FLUTTER ABLATION;  Surgeon: Evans Lance, MD;  Location: [facility];  Service: Cardiovascular;  Laterality: N/A;   [place] SURGERY  2005   POLYPECTOMY     TONSILLECTOMY  1962     Family History  Problem Relation Age of Onset   Esophageal cancer Father     Colon cancer Neg Hx    Stomach cancer Neg Hx    Colon polyps Neg Hx    Rectal cancer Neg Hx      Social History   Socioeconomic History   Marital status: Married    Spouse name: Not on file   Number of children: Not on file   Years of education: Not on file   Highest education level: Not on file  Occupational History   Not on file  Tobacco Use   Smoking status: Never   Smokeless tobacco: Never  Substance and Sexual Activity   Alcohol use: Yes    Alcohol/week: 7.0 standard drinks    Types: 7 Glasses of wine per week   Drug use: No   Sexual activity: Not on file  Other Topics Concern   Not on file  Social History Narrative   Not on file   Social Determinants of Health   Financial Resource Strain: Not on file  Food Insecurity: Not on file  Transportation Needs: Not on file  Physical Activity: Not on file  Stress: Not on file  Social Connections: Not on file  Intimate Partner Violence: Not on file     BP 122/70   Pulse 69   Ht '5\' 6"'$  (1.676 m)   SpO2 95%   BMI  17.92 kg/m   Physical Exam:  Well appearing NAD HEENT: Unremarkable Neck:  No JVD, no thyromegally Lymphatics:  No adenopathy Back:  No CVA tenderness Lungs:  Clear -  HEART:  Regular rate rhythm, no murmurs, no rubs, no clicks Abd:  soft, positive bowel sounds, no organomegally, no rebound, no guarding Ext:  2 plus pulses, no edema, no cyanosis, no clubbing Skin:  No rashes no nodules Neuro:  CN II through XII intact, motor grossly intact  EKG - nsr  Assess/Plan:  1. Atrial flutter - she appears to be maintaining NSR. 2. Atrial fib - she is on flecainide 50 bid. I have changed her prescription so that if she goes out of rhythm, she can take an additional 50 mg of flecainide.   Mikle Bosworth.D.

## 2021-02-22 ENCOUNTER — Other Ambulatory Visit: Payer: Self-pay | Admitting: Internal Medicine

## 2021-04-14 ENCOUNTER — Other Ambulatory Visit: Payer: Self-pay

## 2021-04-14 MED ORDER — FLECAINIDE ACETATE 50 MG PO TABS: ORAL_TABLET | ORAL | 2 refills | Status: DC

## 2021-04-14 NOTE — Telephone Encounter (Signed)
Pt's medication was sent to pt's pharmacy as requested. Confirmation received.  °

## 2021-07-20 ENCOUNTER — Ambulatory Visit (INDEPENDENT_AMBULATORY_CARE_PROVIDER_SITE_OTHER): Payer: Commercial Managed Care - HMO

## 2021-07-20 ENCOUNTER — Other Ambulatory Visit: Payer: Self-pay

## 2021-07-20 ENCOUNTER — Ambulatory Visit: Payer: Commercial Managed Care - HMO | Admitting: Podiatry

## 2021-07-20 DIAGNOSIS — M7671 Peroneal tendinitis, right leg: Secondary | ICD-10-CM | POA: Diagnosis not present

## 2021-07-20 DIAGNOSIS — M79671 Pain in right foot: Secondary | ICD-10-CM

## 2021-07-20 DIAGNOSIS — M79672 Pain in left foot: Secondary | ICD-10-CM

## 2021-07-20 DIAGNOSIS — M25571 Pain in right ankle and joints of right foot: Secondary | ICD-10-CM

## 2021-07-20 MED ORDER — MELOXICAM 7.5 MG PO TABS: 7.5000 mg | ORAL_TABLET | Freq: Every day | ORAL | 0 refills | Status: DC | PRN

## 2021-07-20 NOTE — Patient Instructions (Addendum)
If was nice to meet you today. If you have any questions or any further concerns, please feel fee to give me a call. You can call our office at [phone] or please feel fee to send me a message through [employer] [address].   For instructions on how to put on your Tri-Lock Ankle Brace, please visit [email or web address]  Once the ankle/foot starts to feel better then you can start the below exercises.   Peroneal Tendinopathy Rehab Ask your health care provider which exercises are safe for you. Do exercises exactly as told by your health care provider and adjust them as directed. It is normal to feel mild stretching, pulling, tightness, or discomfort as you do these exercises. Stop right away if you feel sudden pain or your pain gets worse. Do not begin these exercises until told by your health care provider. Stretching and range-of-motion exercises These exercises warm up your muscles and joints and improve the movement and flexibility of your ankle. These exercises also help to relieve pain and stiffness. Gastroc and soleus stretch, standing This is an exercise in which you stand on a step and use your body weight to stretch your calf muscles. To do this exercise: Stand on the edge of a step on the ball of your left / right foot. The ball of your foot is on the walking surface, right under your toes. Keep your other foot firmly on the same step. Hold on to the wall, a railing, or a chair for balance. Slowly lift your other foot, allowing your body weight to press your left / right heel down over the edge of the step. You should feel a stretch in your left / right calf (gastrocnemius and soleus). Hold this position for __________ seconds. Return both feet to the step. Repeat this exercise with a slight bend in your left / right knee. Repeat __________ times with your left / right knee straight and __________ times with your left / right knee bent. Complete this exercise __________ times a  day. Strengthening exercises These exercises build strength and endurance in your foot and ankle. Endurance is the ability to use your muscles for a long time, even after they get tired. Ankle dorsiflexion with band  Secure a rubber exercise band or tube to an object, such as a table leg, that will not move when the band is pulled. Secure the other end of the band around your left / right foot. Sit on the floor, facing the object with your left / right leg extended. The band or tube should be slightly tense when your foot is relaxed. Slowly flex your left / right ankle and toes to bring your foot toward you (dorsiflexion). Hold this position for __________ seconds. Let the band or tube slowly pull your foot back to the starting position. Repeat __________ times. Complete this exercise __________ times a day. Ankle eversion Sit on the floor with your legs straight out in front of you. Loop a rubber exercise band or tube around the ball of your left / right foot. The ball of your foot is on the walking surface, right under your toes. Hold the ends of the band in your hands, or secure the band to a stable object. The band or tube should be slightly tense when your foot is relaxed. Slowly push your foot outward, away from your other leg (eversion). Hold this position for __________ seconds. Slowly return your foot to the starting position. Repeat __________ times. Complete this exercise __________ times a  day. Plantar flexion, standing This exercise is sometimes called standing heel raise. Stand with your feet shoulder-width apart. Place your hands on a wall or table to steady yourself as needed, but try not to use it for support. Keep your weight spread evenly over the width of your feet while you slowly rise up on your toes (plantar flexion). If told by your health care provider: Shift your weight toward your left / right leg until you feel challenged. Stand on your left / right leg  only. Hold this position for __________ seconds. Repeat __________ times. Complete this exercise __________ times a day. Single leg stand Without shoes, stand near a railing or in a doorway. You may hold on to the railing or door frame as needed. Stand on your left / right foot. Keep your big toe down on the floor and try to keep your arch lifted. Do not roll to the outside of your foot. If this exercise is too easy, you can try it with your eyes closed or while standing on a pillow. Hold this position for __________ seconds. Repeat __________ times. Complete this exercise __________ times a day. This information is not intended to replace advice given to you by your health care provider. Make sure you discuss any questions you have with your health care provider. Document Revised: 10/10/2018 Document Reviewed: 10/10/2018 Elsevier Patient Education  [place].  Meloxicam Tablets What is this medication? MELOXICAM (mel OX i cam) treats mild to moderate pain, inflammation, or arthritis. It works by decreasing inflammation. It belongs to a group of medications called NSAIDs. This medicine may be used for other purposes; ask your health care provider or pharmacist if you have questions. COMMON BRAND NAME(S): Mobic What should I tell my care team before I take this medication? They need to know if you have any of these conditions: Asthma (lung or breathing disease) Bleeding disorder Coronary artery bypass graft (CABG) within the past 2 weeks Dehydration Heart attack Heart disease Heart failure High blood pressure If you often drink alcohol Kidney disease Liver disease Smoke tobacco cigarettes Stomach bleeding Stomach ulcers, other stomach or intestine problems Take medications that treat or prevent blood clots Taking other steroids like dexamethasone or prednisone An unusual or allergic reaction to meloxicam, other medications, foods, dyes, or preservatives Pregnant or  trying to get pregnant Breast-feeding How should I use this medication? Take this medication by mouth. Take it as directed on the prescription label at the same time every day. You can take it with or without food. If it upsets your stomach, take it with food. Do not use it more often than directed. There may be unused or extra doses in the bottle after you finish your treatment. Talk to your care team if you have questions about your dose. A special MedGuide will be given to you by the pharmacist with each prescription and refill. Be sure to read this information carefully each time. Talk to your care team about the use of this medication in children. Special care may be needed. Patients over 8 years of age may have a stronger reaction and need a smaller dose. Overdosage: If you think you have taken too much of this medicine contact a poison control center or emergency room at once. NOTE: This medicine is only for you. Do not share this medicine with others. What if I miss a dose? If you miss a dose, take it as soon as you can. If it is almost time for your  next dose, take only that dose. Do not take double or extra doses. What may interact with this medication? Do not take this medication with any of the following: Cidofovir Ketorolac This medication may also interact with the following: Aspirin and aspirin-like medications Certain medications for blood pressure, heart disease, irregular heart beat Certain medications for depression, anxiety, or psychotic disturbances Certain medications that treat or prevent blood clots like warfarin, enoxaparin, dalteparin, apixaban, dabigatran, rivaroxaban Cyclosporine Diuretics Fluconazole Lithium Methotrexate Other NSAIDs, medications for pain and inflammation, like ibuprofen and naproxen Pemetrexed This list may not describe all possible interactions. Give your health care provider a list of all the medicines, herbs, non-prescription drugs, or  dietary supplements you use. Also tell them if you smoke, drink alcohol, or use illegal drugs. Some items may interact with your medicine. What should I watch for while using this medication? Visit your care team for regular checks on your progress. Tell your care team if your symptoms do not start to get better or if they get worse. Do not take other medications that contain aspirin, ibuprofen, or naproxen with this medication. Side effects such as stomach upset, nausea, or ulcers may be more likely to occur. Many non-prescription medications contain aspirin, ibuprofen, or naproxen. Always read labels carefully. This medication can cause serious ulcers and bleeding in the stomach. It can happen with no warning. Smoking, drinking alcohol, older age, and poor health can also increase risks. Call your care team right away if you have stomach pain or blood in your vomit or stool. This medication does not prevent a heart attack or stroke. This medication may increase the chance of a heart attack or stroke. The chance may increase the longer you use this medication or if you have heart disease. If you take aspirin to prevent a heart attack or stroke, talk to your care team about using this medication. Alcohol may interfere with the effect of this medication. Avoid alcoholic drinks. This medication may cause serious skin reactions. They can happen weeks to months after starting the medication. Contact your care team right away if you notice fevers or flu-like symptoms with a rash. The rash may be red or purple and then turn into blisters or peeling of the skin. Or, you might notice a red rash with swelling of the face, lips or lymph nodes in your neck or under your arms. Talk to your care team if you are pregnant before taking this medication. Taking this medication between weeks 20 and 30 of pregnancy may harm your unborn baby. Your care team will monitor you closely if you need to take it. After 30 weeks of  pregnancy, do not take this medication. You may get drowsy or dizzy. Do not drive, use machinery, or do anything that needs mental alertness until you know how this medication affects you. Do not stand up or sit up quickly, especially if you are an older patient. This reduces the risk of dizzy or fainting spells. Be careful brushing or flossing your teeth or using a toothpick because you may get an infection or bleed more easily. If you have any dental work done, tell your dentist you are receiving this medication. This medication may make it more difficult to get pregnant. Talk to your care team if you are concerned about your fertility. What side effects may I notice from receiving this medication? Side effects that you should report to your care team as soon as possible: Allergic reactions--skin rash, itching, hives, swelling of the  face, lips, tongue, or throat Bleeding--bloody or black, tar-like stools, vomiting blood or brown material that looks like coffee grounds, red or dark brown urine, small red or purple spots on skin, unusual bruising or bleeding Heart attack--pain or tightness in the chest, shoulders, arms, or jaw, nausea, shortness of breath, cold or clammy skin, feeling faint or lightheaded Heart failure--shortness of breath, swelling of ankles, feet, or hands, sudden weight gain, unusual weakness or fatigue Increase in blood pressure Kidney injury--decrease in the amount of urine, swelling of the ankles, hands, or feet Liver injury--right upper belly pain, loss of appetite, nausea, light-colored stool, dark yellow or brown urine, yellowing skin or eyes, unusual weakness or fatigue Rash, fever, and swollen lymph nodes Redness, blistering, peeling, or loosening of the skin, including inside the mouth Stroke--sudden numbness or weakness of the face, arm, or leg, trouble speaking, confusion, trouble walking, loss of balance or coordination, dizziness, severe headache, change in  vision Side effects that usually do not require medical attention (report to your care team if they continue or are bothersome): Diarrhea Nausea Upset stomach This list may not describe all possible side effects. Call your doctor for medical advice about side effects. You may report side effects to FDA at 1-800-FDA-1088. Where should I keep my medication? Keep out of the reach of children and pets. Store at room temperature between 20 and 25 degrees C (68 and 77 degrees F). Protect from moisture. Keep the container tightly closed. Get rid of any unused medication after the expiration date. To get rid of medications that are no longer needed or have expired: Take the medication to a medication take-back program. Check with your pharmacy or law enforcement to find a location. If you cannot return the medication, check the label or package insert to see if the medication should be thrown out in the garbage or flushed down the toilet. If you are not sure, ask your care team. If it is safe to put it in the trash, empty the medication out of the container. Mix the medication with cat litter, dirt, coffee grounds, or other unwanted substance. Seal the mixture in a bag or container. Put it in the trash. NOTE: This sheet is a summary. It may not cover all possible information. If you have questions about this medicine, talk to your doctor, pharmacist, or health care provider.  2022 Elsevier/Gold Standard (2020-09-17 00:00:00)

## 2021-07-23 NOTE — Progress Notes (Signed)
Subjective:   Patient ID: Kayla Jacobs, female   DOB: 66 y.o.   MRN: [id number]   HPI 66 year old female presents the office today for concerns of right ankle discomfort, peroneal tendinitis.  She states that she follow-up with her primary care physician.  This started about 2 months ago.  X-rays were performed at her primary care physician's office which were normal she states.  She states that 7 years ago she heard a pop into the area and ever since then she has had some intermittent discomfort but this time the pain and some swelling is not improving.  No recent injury that she reports.  She does get some tingling along the area of discomfort but no radiating pain.  She has noticed swelling more towards the end of the day.  No recent treatment.   Review of Systems  All other systems reviewed and are negative.  Past Medical History:  Diagnosis Date   Cancer ([place])    basal cell skin cancer   Osteopenia     Past Surgical History:  Procedure Laterality Date   A-FLUTTER ABLATION N/A 04/06/2019   Procedure: A-FLUTTER ABLATION;  Surgeon: Evans Lance, MD;  Location: [facility];  Service: Cardiovascular;  Laterality: N/A;   CESAREAN SECTION  1988   COLONOSCOPY     LUMBAR [place] SURGERY  2005   POLYPECTOMY     TONSILLECTOMY  1962     Current Outpatient Medications:    meloxicam (MOBIC) 7.5 MG tablet, Take 1 tablet (7.5 mg total) by mouth daily as needed for pain., Disp: 14 tablet, Rfl: 0   Ascorbic Acid (VITAMIN C) 500 MG CHEW, Chew 500 mg by mouth daily., Disp: , Rfl:    aspirin EC 81 MG tablet, Take 1 tablet (81 mg total) by mouth daily. (Patient not taking: Reported on 01/27/2021), Disp: 90 tablet, Rfl: 3   Calcium Carbonate-Vitamin D (CALCIUM-D PO), Take 1 tablet by mouth once a week., Disp: , Rfl:    diltiazem (CARDIZEM) 30 MG tablet, Take 1 tablet (30 mg total) by mouth 4 (four) times daily as needed (heart racing)., Disp: 60 tablet, Rfl: 4   flecainide (TAMBOCOR) 50 MG  tablet, TAKE 1 TABLET BY MOUTH TWICE DAILY. MAY TAKE ADDITIONAL TABLET DAILY FOR BREAKTHROUGH PALPITATIONS, Disp: 270 tablet, Rfl: 2   levothyroxine (SYNTHROID) 25 MCG tablet, Take 12.5 mcg by mouth every morning., Disp: , Rfl:    Multiple Vitamins-Minerals (ADULT GUMMY PO), Take 2 tablets by mouth daily. Vitafusion Gummy Vitamins for Women-, Disp: , Rfl:   No Known Allergies        Objective:  Physical Exam  General: AAO x3, NAD  Dermatological: Skin is warm, dry and supple bilateral. There are no open sores, no preulcerative lesions, no rash or signs of infection present.  Vascular: Dorsalis Pedis artery and Posterior Tibial artery pedal pulses are 2/4 bilateral with immedate capillary fill time.  There is no pain with calf compression, swelling, warmth, erythema.   Neruologic: Grossly intact via light touch bilateral.  Negative Tinel sign.  Musculoskeletal: Tenderness palpation of the course of the peroneal tendon just posterior and inferior to the lateral malleolus there is localized swelling.  There is no area of pinpoint tenderness.  Clinically the tendon appears to be intact.  MMT 5/5.  Mild discomfort with inversion.  Decreased medial arch upon weightbearing.  Gait: Unassisted, Nonantalgic.       Assessment:   Peroneal tendonitis     Plan:  -Treatment options  discussed including all alternatives, risks, and complications -Etiology of symptoms were discussed -We will try to obtain x-rays from her primary care physician. -Concerned about the swelling as well as discomfort with her history.  Discussed MRI but ultimately decided to hold off on this today for now.  Recommend immobilization in an ankle brace, Tri-Lock ankle brace was dispensed.  Discussed stretching, icing.  Anti-inflammatories as needed.  As she starts to feel better discussed different exercises that she can use for rehab.  Long-term discussed shoe modifications and good arch support. -Immobilization,  anti-inflammatories, icing we will then obtain MRI.  Trula Slade DPM

## 2021-08-04 ENCOUNTER — Other Ambulatory Visit: Payer: Self-pay

## 2021-08-04 ENCOUNTER — Ambulatory Visit (INDEPENDENT_AMBULATORY_CARE_PROVIDER_SITE_OTHER): Payer: Commercial Managed Care - HMO

## 2021-08-04 DIAGNOSIS — M7672 Peroneal tendinitis, left leg: Secondary | ICD-10-CM | POA: Diagnosis not present

## 2021-08-04 DIAGNOSIS — M7671 Peroneal tendinitis, right leg: Secondary | ICD-10-CM

## 2021-08-04 NOTE — Progress Notes (Signed)
SITUATION Reason for Consult: Evaluation for Bilateral Custom Foot Orthoses Patient / Caregiver Report: Patient is ready for foot orthotics  OBJECTIVE DATA: Patient History / Diagnosis:    ICD-10-CM   1. Peroneal tendinitis of right lower extremity  M76.71       Current or Previous Devices: None  Foot Examination: Skin presentation:   Intact Ulcers & Callousing:   None and no history Toe / Foot Deformities:  None Weight Bearing Presentation:  Rectus Sensation:    Intact  ORTHOTIC RECOMMENDATION Recommended Device: 1x pair of custom functional foot orthotics  GOALS OF ORTHOSES - Reduce Pain - Prevent Foot Deformity - Prevent Progression of Further Foot Deformity - Relieve Pressure - Improve the Overall Biomechanical Function of the Foot and Lower Extremity.  ACTIONS PERFORMED Patient was casted for Foot Orthoses via crush box. Procedure was explained and patient tolerated procedure well. All questions were answered and concerns addressed.  PLAN Potential out of pocket cost was communicated to patient. Casts are to be sent to [facility] for fabrication. Patient is to be called for fitting when devices are ready.

## 2021-08-20 ENCOUNTER — Other Ambulatory Visit: Payer: Self-pay

## 2021-08-20 ENCOUNTER — Ambulatory Visit: Payer: Commercial Managed Care - HMO | Admitting: Podiatry

## 2021-08-20 DIAGNOSIS — M7671 Peroneal tendinitis, right leg: Secondary | ICD-10-CM | POA: Diagnosis not present

## 2021-08-23 ENCOUNTER — Encounter: Payer: Self-pay | Admitting: Podiatry

## 2021-08-23 NOTE — Progress Notes (Signed)
Subjective: 66 year old female presents the office today for evaluation of right ankle discomfort, peroneal tendinitis.  She states that overall she is doing better.  She still has some discomfort she is using the brace but she is no longer limping.  She took meloxicam for a few days.  She states is not fully healed but is getting there.  She has no other concerns today.  No recent injury or changes otherwise.  Objective: AAO x3, NAD DP/PT pulses palpable bilaterally, CRT less than 3 seconds There is still tenderness palpation of the peroneal tendon inferior to the lateral malleolus today.  There is still localized edema present.  Clinically the tendon appears to be intact.  No other area discomfort.  No pain with range of motion.  MMT 5/5. No pain with calf compression, swelling, warmth, erythema  Assessment: Peroneal tendinitis, likely partial tear of peroneal tendon  Plan: -All treatment options discussed with the patient including all alternatives, risks, complications.  -Continue with the brace as needed as well as supportive shoe gear.  We will refer her to physical therapy.  Prescription for [facility] was written today.  If symptoms continue or not improve or resolve we will order an MRI but at this point when to continue with conservative management. -Patient encouraged to call the office with any questions, concerns, change in symptoms.   Trula Slade DPM

## 2021-08-24 NOTE — Telephone Encounter (Signed)
Please advise 

## 2021-09-08 ENCOUNTER — Telehealth: Payer: Self-pay

## 2021-09-08 NOTE — Telephone Encounter (Signed)
Lvm for pt to schedule orthotics pick up

## 2021-10-01 ENCOUNTER — Ambulatory Visit: Payer: Self-pay

## 2021-10-01 ENCOUNTER — Ambulatory Visit (INDEPENDENT_AMBULATORY_CARE_PROVIDER_SITE_OTHER): Payer: 59 | Admitting: Podiatry

## 2021-10-01 DIAGNOSIS — M25571 Pain in right ankle and joints of right foot: Secondary | ICD-10-CM | POA: Diagnosis not present

## 2021-10-01 DIAGNOSIS — M7671 Peroneal tendinitis, right leg: Secondary | ICD-10-CM

## 2021-10-01 DIAGNOSIS — M79671 Pain in right foot: Secondary | ICD-10-CM

## 2021-10-01 NOTE — Progress Notes (Signed)
SITUATION: ?Reason for Visit: Fitting and Delivery of Custom Fabricated Foot Orthoses ?Patient Report: Patient reports comfort and is satisfied with device. ? ?OBJECTIVE DATA: ?Patient History / Diagnosis:   ?  ICD-10-CM   ?1. Peroneal tendinitis of right lower extremity  M76.71   ?  ?2. Bilateral foot pain  M79.671   ? I94.854   ?  ? ? ?Provided Device:  Custom Functional Foot Orthotics ?    [facility]: [id number] ? ?GOAL OF ORTHOSIS ?- Improve gait ?- Decrease energy expenditure ?- Improve Balance ?- Provide Triplanar stability of foot complex ?- Facilitate motion ? ?ACTIONS PERFORMED ?Patient was fit with foot orthotics trimmed to shoe last. Patient tolerated fittign procedure.  ? ?Patient was provided with verbal and written instruction and demonstration regarding donning, doffing, wear, care, proper fit, function, purpose, cleaning, and use of the orthosis and in all related precautions and risks and benefits regarding the orthosis. ? ?Patient was also provided with verbal instruction regarding how to report any failures or malfunctions of the orthosis and necessary follow up care. Patient was also instructed to contact our office regarding any change in status that may affect the function of the orthosis. ? ?Patient demonstrated independence with proper donning, doffing, and fit and verbalized understanding of all instructions. ? ?PLAN: ?Patient is to follow up in one week or as necessary (PRN). All questions were answered and concerns addressed. Plan of care was discussed with and agreed upon by the patient. ? ? ?

## 2021-10-06 DIAGNOSIS — M7671 Peroneal tendinitis, right leg: Secondary | ICD-10-CM | POA: Insufficient documentation

## 2021-10-06 NOTE — Progress Notes (Signed)
Subjective: ?66 year old female presents the office today for follow-up evaluation of right ankle pain, peroneal tendinitis.  She is still doing physical therapy which has been helpful.  The lateral wedge on the orthotic that I made has been very helpful for her.  Overall she feels that she is improving.  Also presents today to see prior to pick up orthotics.  No new concerns otherwise. ? ?Objective: ?AAO x3, NAD ?DP/PT pulses palpable bilaterally, CRT less than 3 seconds ?There is still some edema present along the course of the peroneal tendon inferior to the lateral malleolus.  Slight discomfort on palpation but overall much improved clinically the tendon appears to be intact no pain with ankle joint range of motion.  MMT 5/5. ?No pain with calf compression, swelling, warmth, erythema ? ?Assessment: ?Peroneal tendinitis right side ? ?Plan: ?-All treatment options discussed with the patient including all alternatives, risks, complications.  ?-Overall doing better.  Discussed getting an MRI but she is continuing to improve and will be discontinued the treatment plan at this time.  Continue physical therapy.  She has a good arch support.  Custom orthotics were dispensed today with [facility] Ed[facility].  Discussed that she needs any modifications on the scale. ?-Patient encouraged to call the office with any questions, concerns, change in symptoms.  ? ?Trula Slade DPM ? ?

## 2021-10-12 ENCOUNTER — Encounter: Payer: Self-pay | Admitting: Internal Medicine

## 2021-11-01 ENCOUNTER — Encounter: Payer: Self-pay | Admitting: Podiatry

## 2021-11-02 NOTE — Telephone Encounter (Signed)
Please advise 

## 2021-11-04 ENCOUNTER — Other Ambulatory Visit: Payer: Self-pay | Admitting: Podiatry

## 2021-11-04 DIAGNOSIS — M7671 Peroneal tendinitis, right leg: Secondary | ICD-10-CM

## 2021-11-10 ENCOUNTER — Ambulatory Visit
Admission: RE | Admit: 2021-11-10 | Discharge: 2021-11-10 | Disposition: A | Discharge status: Home / Self Care | Payer: 59 | Source: Ambulatory Visit | Attending: Podiatry | Admitting: Podiatry

## 2021-11-10 DIAGNOSIS — M7671 Peroneal tendinitis, right leg: Secondary | ICD-10-CM

## 2021-11-20 ENCOUNTER — Encounter: Payer: Self-pay | Admitting: Podiatry

## 2021-11-23 NOTE — Telephone Encounter (Signed)
Can someone please cancel this for her? Thanks!

## 2021-12-01 ENCOUNTER — Ambulatory Visit: Payer: 59 | Admitting: Podiatry

## 2021-12-07 ENCOUNTER — Other Ambulatory Visit: Payer: Self-pay | Admitting: Orthopaedic Surgery

## 2021-12-07 DIAGNOSIS — M7671 Peroneal tendinitis, right leg: Secondary | ICD-10-CM

## 2021-12-17 ENCOUNTER — Ambulatory Visit
Admission: RE | Admit: 2021-12-17 | Discharge: 2021-12-17 | Disposition: A | Discharge status: Home / Self Care | Payer: 59 | Source: Ambulatory Visit | Attending: Orthopaedic Surgery | Admitting: Orthopaedic Surgery

## 2021-12-17 DIAGNOSIS — M7671 Peroneal tendinitis, right leg: Secondary | ICD-10-CM

## 2021-12-23 ENCOUNTER — Encounter: Payer: Self-pay | Admitting: Internal Medicine

## 2021-12-29 ENCOUNTER — Encounter: Payer: Self-pay | Admitting: Internal Medicine

## 2021-12-29 MED ORDER — DILTIAZEM HCL 30 MG PO TABS: 30.0000 mg | ORAL_TABLET | Freq: Four times a day (QID) | ORAL | 1 refills | Status: DC | PRN

## 2021-12-29 MED ORDER — FLECAINIDE ACETATE 50 MG PO TABS: ORAL_TABLET | ORAL | 0 refills | Status: DC

## 2021-12-31 ENCOUNTER — Encounter: Payer: Self-pay | Admitting: Internal Medicine

## 2022-01-10 NOTE — Progress Notes (Unsigned)
Cardiology Office Note Date:  01/10/2022  Patient ID:  Kayla, Jacobs [birth year], MRN [id number] PCP:  Jonathon Jordan, MD   Electrophysiologist: Dr. Lovena Le  ***refresh   Chief Complaint: *** annual visit  History of Present Illness: Kayla Jacobs is a 66 y.o. female with history of AFlutter (ablated), Afib   She come sin today to be seen for Dr. Lovena Le, last seen by him July 2022.  She had hx of a flutter ablation, during that procedure noted to as well have AFib and started on flecainide. She was doing well at the time of her visit without symptoms of AFib, discussed taking an additional flecainide tab PRN for palpitations/goes out of rhythm  She sent a message that she has been spending more tie out of rhythm and inquired about moving up her appt.  *** Only has PRN dilt, is she taking any nodal blocker daily with her flecainide?  *** flecainide intervals Trigger? *** risk score, one for age, 2 with gender.   Past Medical History:  Diagnosis Date   Cancer ([place])    basal cell skin cancer   Osteopenia     Past Surgical History:  Procedure Laterality Date   A-FLUTTER ABLATION N/A 04/06/2019   Procedure: A-FLUTTER ABLATION;  Surgeon: Evans Lance, MD;  Location: [place] [facility];  Service: Cardiovascular;  Laterality: N/A;   CESAREAN SECTION  1988   COLONOSCOPY     LUMBAR [place] SURGERY  2005   POLYPECTOMY     TONSILLECTOMY  1962    Current Outpatient Medications  Medication Sig Dispense Refill   Ascorbic Acid (VITAMIN C) 500 MG CHEW Chew 500 mg by mouth daily.     aspirin EC 81 MG tablet Take 1 tablet (81 mg total) by mouth daily. (Patient not taking: Reported on 01/27/2021) 90 tablet 3   Calcium Carbonate-Vitamin D (CALCIUM-D PO) Take 1 tablet by mouth once a week.     diltiazem (CARDIZEM) 30 MG tablet Take 1 tablet (30 mg total) by mouth 4 (four) times daily as needed (heart racing). 60 tablet 1   flecainide (TAMBOCOR) 50 MG tablet TAKE 1 TABLET BY MOUTH TWICE  DAILY. MAY TAKE ADDITIONAL TABLET DAILY FOR BREAKTHROUGH PALPITATIONS 270 tablet 0   levothyroxine (SYNTHROID) 25 MCG tablet Take 12.5 mcg by mouth every morning.     meloxicam (MOBIC) 7.5 MG tablet Take 1 tablet (7.5 mg total) by mouth daily as needed for pain. 14 tablet 0   Multiple Vitamins-Minerals (ADULT GUMMY PO) Take 2 tablets by mouth daily. Vitafusion Gummy Vitamins for Women-     No current facility-administered medications for this visit.    Allergies:   Patient has no known allergies.   Social History:  The patient  reports that she has never smoked. She has never used smokeless tobacco. She reports current alcohol use of about 7.0 standard drinks of alcohol per week. She reports that she does not use drugs.   Family History:  The patient's family history includes Esophageal cancer in her father.  ROS:  Please see the history of present illness.    All other systems are reviewed and otherwise negative.   PHYSICAL EXAM:  VS:  There were no vitals taken for this visit. BMI: There is no height or weight on file to calculate BMI. Well nourished, well developed, in no acute distress HEENT: normocephalic, atraumatic Neck: no JVD, carotid bruits or masses Cardiac:  *** RRR; no significant murmurs, no rubs, or gallops Lungs:  ***  CTA b/l, no wheezing, rhonchi or rales Abd: soft, nontender MS: no deformity or *** atrophy Ext: *** no edema Skin: warm and dry, no rash Neuro:  No gross deficits appreciated Psych: euthymic mood, full affect    EKG:  Done today and reviewed by myself shows  ***  04/06/2019: EPS/ablation Conclusion: Successful electrophysiology study and catheter ablation of atrial flutter with creation of bidirectional block in the atrial flutter isthmus.  There was inducible atrial fibrillation of unclear clinical significance as the patient has never had documented atrial fibrillation, always atrial flutter   08/26/2018: TTE  1. The left ventricle has normal  systolic function with an ejection  fraction of 60-65%. The cavity size was normal. Left ventricular diastolic  parameters were normal.   2. The right ventricle has normal systolic function. The cavity was  normal. There is no increase in right ventricular wall thickness.   3. Mild mitral valve prolapse.   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   5. The tricuspid valve is normal in structure.   6. The aortic valve is normal in structure.   7. The pulmonic valve was normal in structure.   Recent Labs: No results found for requested labs within last 365 days.  No results found for requested labs within last 365 days.   CrCl cannot be calculated (Patient's most recent lab result is older than the maximum 21 days allowed.).   Wt Readings from Last 3 Encounters:  08/28/19 111 lb (50.3 kg)  05/04/19 110 lb 6.4 oz (50.1 kg)  04/06/19 110 lb (49.9 kg)     Other studies reviewed: Additional studies/records reviewed today include: summarized above  ASSESSMENT AND PLAN:  AFlutter Ablated 2.   Paroxysmal Afib CHA2DS2Vasc is ***one for age (2 w/gender) Not on a/c *** flecainide with *** intervals *** increased burden of palpitations.arrhythmia ***   Disposition: F/u with ***  Current medicines are reviewed at length with the patient today.  The patient did not have any concerns regarding medicines.  Venetia Night, PA-C 01/10/2022 5:18 PM     Kayla Jacobs [id number] [phone] (office)  [phone] (fax)

## 2022-01-13 ENCOUNTER — Ambulatory Visit: Payer: 59 | Admitting: Physician Assistant

## 2022-01-13 ENCOUNTER — Encounter: Payer: Self-pay | Admitting: Physician Assistant

## 2022-01-13 VITALS — BP 112/70 | HR 80 | Ht 65.5 in | Wt 109.6 lb

## 2022-01-13 DIAGNOSIS — I484 Atypical atrial flutter: Secondary | ICD-10-CM

## 2022-01-13 DIAGNOSIS — I480 Paroxysmal atrial fibrillation: Secondary | ICD-10-CM

## 2022-01-13 MED ORDER — APIXABAN 5 MG PO TABS: 5.0000 mg | ORAL_TABLET | Freq: Two times a day (BID) | ORAL | 11 refills | Status: DC

## 2022-01-13 MED ORDER — DILTIAZEM HCL ER COATED BEADS 120 MG PO CP24: 120.0000 mg | ORAL_CAPSULE | Freq: Every day | ORAL | 3 refills | Status: DC

## 2022-01-13 NOTE — Patient Instructions (Signed)
Medication Instructions:  Your physician has recommended you make the following change in your medication:  STOP FLECAINIDE  START ELIQUIS 5 MG  TWICE DAILY  START DILTIAZEM 120 MG DAILY  YOU MAY CONTINUE DILTIAZEM 30 MG AS NEEDED  *If you need a refill on your cardiac medications before your next appointment, please call your pharmacy*   Lab Work: TODAY: BMET, CBC If you have labs (blood work) drawn today and your tests are completely normal, you will receive your results only by: [place] (if you have MyChart) OR A paper copy in the mail If you have any lab test that is abnormal or we need to change your treatment, we will call you to review the results.   Testing/Procedures:  You are scheduled for a TEE on AUGUST 3RD with Dr. Gasper Sells   Please arrive at the [facility] (Main Entrance A) at [facility]: [place], [place] [address] at 8 am. DIET: Nothing to eat or drink after midnight except a sip of water with medications (see medication instructions below)  FYI: For your safety, and to allow [place] to monitor your vital signs accurately during the surgery/procedure we request that   if you have artificial nails, gel coating, SNS etc. Please have those removed prior to your surgery/procedure. Not having the nail coverings /polish removed may result in cancellation or delay of your surgery/procedure.   Medication Instructions: Continue your anticoagulant: ELIQUIS 5 MG  You will need to continue your anticoagulant after your procedure until you  are told by your  Provider that it is safe to stop  You must have a responsible person to drive you home and stay in the waiting area during your procedure. Failure to do so could result in cancellation.  Bring your insurance cards.  *Special Note: Every effort is made to have your procedure done on time. Occasionally there are emergencies that occur at the hospital that may cause delays. Please be patient if a  delay does occur.     Follow-Up: At [facility], you and your health needs are our priority.  As part of our continuing mission to provide you with exceptional heart care, we have created designated Provider Care Teams.  These Care Teams include your primary Cardiologist (physician) and Advanced Practice Providers (APPs -  Physician Assistants and Nurse Practitioners) who all work together to provide you with the care you need, when you need it.  We recommend signing up for the patient portal called "MyChart".  Sign up information is provided on this After Visit Summary.  MyChart is used to connect with patients for Virtual Visits (Telemedicine).  Patients are able to view lab/test results, encounter notes, upcoming appointments, etc.  Non-urgent messages can be sent to your provider as well.   To learn more about what you can do with MyChart, go to [email or web address].    Your next appointment:   KEEP SCHEDULED FOLLOW-UP   Important Information About Sugar

## 2022-01-14 LAB — CBC
Hematocrit: 39.6 % (ref 34.0–46.6)
Hemoglobin: 13.5 g/dL (ref 11.1–15.9)
MCH: 32.8 pg (ref 26.6–33.0)
MCHC: 34.1 g/dL (ref 31.5–35.7)
MCV: 96 fL (ref 79–97)
Platelets: 190 10*3/uL (ref 150–450)
RBC: 4.12 x10E6/uL (ref 3.77–5.28)
RDW: 13.1 % (ref 11.7–15.4)
WBC: 4 10*3/uL (ref 3.4–10.8)

## 2022-01-14 LAB — BASIC METABOLIC PANEL
BUN/Creatinine Ratio: 17 (ref 12–28)
BUN: 15 mg/dL (ref 8–27)
CO2: 25 mmol/L (ref 20–29)
Calcium: 9.8 mg/dL (ref 8.7–10.3)
Chloride: 104 mmol/L (ref 96–106)
Creatinine, Ser: 0.9 mg/dL (ref 0.57–1.00)
Glucose: 83 mg/dL (ref 70–99)
Potassium: 4.6 mmol/L (ref 3.5–5.2)
Sodium: 142 mmol/L (ref 134–144)
eGFR: 71 mL/min/{1.73_m2} (ref >59)

## 2022-01-14 MED ORDER — FLECAINIDE ACETATE 50 MG PO TABS: 50.0000 mg | ORAL_TABLET | Freq: Two times a day (BID) | ORAL | 3 refills | Status: DC

## 2022-01-25 ENCOUNTER — Encounter: Payer: Self-pay | Admitting: Internal Medicine

## 2022-02-04 ENCOUNTER — Ambulatory Visit (HOSPITAL_COMMUNITY): Admit: 2022-02-04 | Payer: 59 | Admitting: Internal Medicine

## 2022-02-04 ENCOUNTER — Encounter (HOSPITAL_COMMUNITY): Payer: Self-pay

## 2022-02-04 SURGERY — CARDIOVERSION
Anesthesia: General

## 2022-02-09 ENCOUNTER — Ambulatory Visit: Payer: 59 | Admitting: Internal Medicine

## 2022-02-23 ENCOUNTER — Ambulatory Visit: Payer: 59 | Admitting: Internal Medicine

## 2022-02-23 ENCOUNTER — Encounter: Payer: Self-pay | Admitting: Internal Medicine

## 2022-02-23 VITALS — BP 100/66 | HR 60 | Ht 65.5 in | Wt 108.0 lb

## 2022-02-23 DIAGNOSIS — I4892 Unspecified atrial flutter: Secondary | ICD-10-CM | POA: Diagnosis not present

## 2022-02-23 DIAGNOSIS — I480 Paroxysmal atrial fibrillation: Secondary | ICD-10-CM

## 2022-02-23 NOTE — Patient Instructions (Addendum)
Medication Instructions:  Your physician recommends that you continue on your current medications as directed. Please refer to the Current Medication list given to you today.  *If you need a refill on your cardiac medications before your next appointment, please call your pharmacy* NO CHANGES TO YOUR MEDICATIONS.   Lab Work: None ordered.  If you have labs (blood work) drawn today and your tests are completely normal, you will receive your results only by: [place] (if you have MyChart) OR A paper copy in the mail If you have any lab test that is abnormal or we need to change your treatment, we will call you to review the results.  Testing/Procedures: None ordered.  Follow-Up:  POSSIBLE ATRIAL FLUTTER ABLATION DATES:  SEPT: 8, 18 OCT: 4, 9, 18  PLEASE CONTACT us THROUGH MYCHART OR CALL HEARTCARE AT [phone].     Important Information About Sugar

## 2022-02-23 NOTE — Progress Notes (Signed)
        HPI Kayla Jacobs returns today for followup. The patient is a pleasant 66 yo man with a h/o atrial flutter, s/p catheter ablation where she had PAF during the procedure and occaisional palpitations. She was seen in the office several weeks ago and was found to be in atrial flutter with an atrial rate of 150/min and 2:1 conduction. She spontaneously reverted spontaneously to NSR. Since then she has felt well.  No Known Allergies           Current Outpatient Medications  Medication Sig Dispense Refill   apixaban (ELIQUIS) 5 MG TABS tablet Take 1 tablet (5 mg total) by mouth 2 (two) times daily. 60 tablet 11   Ascorbic Acid (VITAMIN C) 500 MG CHEW Chew 500 mg by mouth daily.       Calcium Carbonate-Vitamin D (CALCIUM-D PO) Take 1 tablet by mouth once a week.       diltiazem (CARDIZEM) 30 MG tablet Take 1 tablet (30 mg total) by mouth 4 (four) times daily as needed (heart racing). 60 tablet 1   flecainide (TAMBOCOR) 50 MG tablet Take 1 tablet (50 mg total) by mouth 2 (two) times daily. 180 tablet 3   levothyroxine (SYNTHROID) 25 MCG tablet Take 12.5 mcg by mouth every morning.       Multiple Vitamins-Minerals (ADULT GUMMY PO) Take 2 tablets by mouth daily. Vitafusion Gummy Vitamins for Women-       aspirin EC 81 MG tablet Take 1 tablet (81 mg total) by mouth daily. (Patient not taking: Reported on 02/23/2022) 90 tablet 3    No current facility-administered medications for this visit.            Past Medical History:  Diagnosis Date   Cancer ([facility])      basal cell skin cancer   Osteopenia        ROS:    All systems reviewed and negative except as noted in the HPI.          Past Surgical History:  Procedure Laterality Date   A-FLUTTER ABLATION N/A 04/06/2019    Procedure: A-FLUTTER ABLATION;  Surgeon: Iridessa Harrow W, MD;  Location: [facility];  Service: Cardiovascular;  Laterality: N/A;   CESAREAN SECTION   1988   COLONOSCOPY       LUMBAR [place] SURGERY   2005    POLYPECTOMY       TONSILLECTOMY   1962             Family History  Problem Relation Age of Onset   Esophageal cancer Father     Colon cancer Neg Hx     Stomach cancer Neg Hx     Colon polyps Neg Hx     Rectal cancer Neg Hx          Social History         Socioeconomic History   Marital status: Married      Spouse name: Not on file   Number of children: Not on file   Years of education: Not on file   Highest education level: Not on file  Occupational History   Not on file  Tobacco Use   Smoking status: Never   Smokeless tobacco: Never  Substance and Sexual Activity   Alcohol use: Yes      Alcohol/week: 7.0 standard drinks of alcohol      Types: 7 Glasses of wine per week   Drug use: No     Sexual activity: Not on file  Other Topics Concern   Not on file  Social History Narrative   Not on file    Social Determinants of Health    Financial Resource Strain: Not on file  Food Insecurity: Not on file  Transportation Needs: Not on file  Physical Activity: Not on file  Stress: Not on file  Social Connections: Not on file  Intimate Partner Violence: Not on file        BP 100/66   Pulse 60   Ht 5' 5.5" (1.664 m)   Wt 108 lb (49 kg)   SpO2 97%   BMI 17.70 kg/m    Physical Exam:   Well appearing NAD HEENT: Unremarkable Neck:  No JVD, no thyromegally Lymphatics:  No adenopathy Back:  No CVA tenderness Lungs:  Clear with no wheezes HEART:  Regular rate rhythm, no murmurs, no rubs, no clicks Abd:  soft, positive bowel sounds, no organomegally, no rebound, no guarding Ext:  2 plus pulses, no edema, no cyanosis, no clubbing Skin:  No rashes no nodules Neuro:  CN II through XII intact, motor grossly intact   Assess/Plan:  Atrial flutter - I have discussed the treatment options with the patient. I have offered her repeat ablation. She will callus if she wishes to proceed. Would use an irrigated catheter and carto and anesthesia. PAF - she has not had any  documented atrial fib on low dose flecainide.  Coags - she has been placed back on eliquis. If she undergoes ablation I would consider stopping the eliquis.    Kayla Passon,MD 

## 2022-02-24 ENCOUNTER — Encounter: Payer: Self-pay | Admitting: Internal Medicine

## 2022-03-24 ENCOUNTER — Encounter: Payer: Self-pay | Admitting: Internal Medicine

## 2022-03-29 ENCOUNTER — Encounter: Payer: Self-pay | Admitting: Internal Medicine

## 2022-03-29 NOTE — Telephone Encounter (Signed)
Left a message for the pt to call back.  

## 2022-03-30 ENCOUNTER — Ambulatory Visit (HOSPITAL_COMMUNITY)
Admission: RE | Admit: 2022-03-30 | Discharge: 2022-03-30 | Disposition: A | Discharge status: Home / Self Care | Payer: 59 | Source: Ambulatory Visit | Attending: Nurse Practitioner | Admitting: Nurse Practitioner

## 2022-03-30 ENCOUNTER — Encounter (HOSPITAL_COMMUNITY): Payer: Self-pay | Admitting: Nurse Practitioner

## 2022-03-30 VITALS — BP 118/66 | HR 64 | Ht 65.5 in | Wt 109.4 lb

## 2022-03-30 DIAGNOSIS — I480 Paroxysmal atrial fibrillation: Secondary | ICD-10-CM

## 2022-03-30 DIAGNOSIS — Z79899 Other long term (current) drug therapy: Secondary | ICD-10-CM | POA: Diagnosis not present

## 2022-03-30 DIAGNOSIS — Z7901 Long term (current) use of anticoagulants: Secondary | ICD-10-CM | POA: Diagnosis not present

## 2022-03-30 DIAGNOSIS — I4892 Unspecified atrial flutter: Secondary | ICD-10-CM | POA: Diagnosis not present

## 2022-03-30 DIAGNOSIS — I4891 Unspecified atrial fibrillation: Secondary | ICD-10-CM | POA: Diagnosis not present

## 2022-03-30 DIAGNOSIS — D6869 Other thrombophilia: Secondary | ICD-10-CM | POA: Diagnosis not present

## 2022-03-30 NOTE — Progress Notes (Signed)
Primary Care Physician: Jonathon Jordan, MD Referring Physician:Dr. [name] is a 67 y.o. female with a h/o afib/aflutter with flutter  ablation 04/2019 and recently return of atrial flutter. She has discussed with Dr. Lovena Le to undergo repeat aflutter ablation and is pending 10/27. In the interim she has had more issues with aflutter and rapid rates and is in the afib clinic to further  discuss medical management to try to get her to ablation and be less symptomatic. Ekg today shows SR. She had taken an  extra flecainide over the last several days trying to settle down her rate as well as 30 mg q 4 hours. She is on 50 mg flecainide bid and eliquis 5 mg bid for a CHA2DS2VASc score of 2, no missed doses. She had been out of rhythm x one week and just went back into normal  rhythm and feels so  much better. She resumed her 120 mg Caredizem this am that she had taken daily in the past.   Today, she denies symptoms of palpitations, chest pain, shortness of breath, orthopnea, PND, lower extremity edema, dizziness, presyncope, syncope, or neurologic sequela. The patient is tolerating medications without difficulties and is otherwise without complaint today.   Past Medical History:  Diagnosis Date   Cancer ([place])    basal cell skin cancer   Osteopenia    Past Surgical History:  Procedure Laterality Date   A-FLUTTER ABLATION N/A 04/06/2019   Procedure: A-FLUTTER ABLATION;  Surgeon: Evans Lance, MD;  Location: [facility];  Service: Cardiovascular;  Laterality: N/A;   CESAREAN SECTION  1988   COLONOSCOPY     LUMBAR [place] SURGERY  2005   POLYPECTOMY     TONSILLECTOMY  1962    Current Outpatient Medications  Medication Sig Dispense Refill   apixaban (ELIQUIS) 5 MG TABS tablet Take 1 tablet (5 mg total) by mouth 2 (two) times daily. 60 tablet 11   Ascorbic Acid (VITAMIN C) 500 MG CHEW Chew 500 mg by mouth daily.     aspirin EC 81 MG tablet Take 1 tablet (81 mg total) by  mouth daily. (Patient not taking: Reported on 02/23/2022) 90 tablet 3   Calcium Carbonate-Vitamin D (CALCIUM-D PO) Take 1 tablet by mouth once a week.     diltiazem (CARDIZEM) 30 MG tablet Take 1 tablet (30 mg total) by mouth 4 (four) times daily as needed (heart racing). 60 tablet 1   flecainide (TAMBOCOR) 50 MG tablet Take 1 tablet (50 mg total) by mouth 2 (two) times daily. 180 tablet 3   levothyroxine (SYNTHROID) 25 MCG tablet Take 12.5 mcg by mouth every morning.     Multiple Vitamins-Minerals (ADULT GUMMY PO) Take 2 tablets by mouth daily. Vitafusion Gummy Vitamins for Women-     No current facility-administered medications for this encounter.    Allergies  Allergen Reactions   Macrobid [Nitrofurantoin]     Burning sensation and swelling    Social History   Socioeconomic History   Marital status: Married    Spouse name: Not on file   Number of children: Not on file   Years of education: Not on file   Highest education level: Not on file  Occupational History   Not on file  Tobacco Use   Smoking status: Never   Smokeless tobacco: Never  Substance and Sexual Activity   Alcohol use: Yes    Alcohol/week: 7.0 standard drinks of alcohol    Types: 7  Glasses of wine per week   Drug use: No   Sexual activity: Not on file  Other Topics Concern   Not on file  Social History Narrative   Not on file   Social Determinants of Health   Financial Resource Strain: Not on file  Food Insecurity: Not on file  Transportation Needs: Not on file  Physical Activity: Not on file  Stress: Not on file  Social Connections: Not on file  Intimate Partner Violence: Not on file    Family History  Problem Relation Age of Onset   Esophageal cancer Father    Colon cancer Neg Hx    Stomach cancer Neg Hx    Colon polyps Neg Hx    Rectal cancer Neg Hx     ROS- All systems are reviewed and negative except as per the HPI above  Physical Exam: Vitals:   03/30/22 1121  Weight: 49.6 kg   Height: 5' 5.5" (1.664 m)   Wt Readings from Last 3 Encounters:  03/30/22 49.6 kg  02/23/22 49 kg  01/13/22 49.7 kg    Labs: Lab Results  Component Value Date   NA 142 01/13/2022   K 4.6 01/13/2022   CL 104 01/13/2022   CO2 25 01/13/2022   GLUCOSE 83 01/13/2022   BUN 15 01/13/2022   CREATININE 0.90 01/13/2022   CALCIUM 9.8 01/13/2022   MG 1.7 08/25/2018   No results found for: "INR" Lab Results  Component Value Date   CHOL 192 08/26/2018   HDL 85 08/26/2018   LDLCALC 101 (H) 08/26/2018   TRIG 30 08/26/2018     GEN- The patient is well appearing, alert and oriented x 3 today.   Head- normocephalic, atraumatic Eyes-  Sclera clear, conjunctiva pink Ears- hearing intact Oropharynx- clear Neck- supple, no JVP Lymph- no cervical lymphadenopathy Lungs- Clear to ausculation bilaterally, normal work of breathing Heart- Regular rate and rhythm, no murmurs, rubs or gallops, PMI not laterally displaced GI- soft, NT, ND, + BS Extremities- no clubbing, cyanosis, or edema MS- no significant deformity or atrophy Skin- no rash or lesion Psych- euthymic mood, full affect Neuro- strength and sensation are intact  EKG-Vent. rate 64 BPM PR interval 172 ms QRS duration 100 ms QT/QTcB 434/447 ms P-R-T axes 50 104 63 Normal sinus rhythm Rightward axis Incomplete right bundle branch block Borderline ECG When compared with ECG of 25-Aug-2018 14:35, PREVIOUS ECG IS PRESENT    Assessment and Plan:  1. Atrial flutter Several days out of rhythm  but spontaneously converted  She took an extra 50 mg flecainide for the last 3 days  She is in SR today and feels improved She had been taking 30 mg cardizem q 6 hours for RVR but will use prn and take 120 mg Cardizem daily  She will stay on 50 mg flecainide bid     2. CHA2DS2VASc of 2  Continue eliquis 5 mg bid  States no missed doses with pending ablation   F/u with Dr. Lovena Le for aflutter ablation as planned 10/27 Afib clinic  as needed   Kayla Jacobs, [facility] [address] [place], [place] [address] [phone]

## 2022-03-30 NOTE — Telephone Encounter (Signed)
Late entry:  I spoke with the pt late yesterday and she says she has been having rapid flutter for about a week.. she has taken her diltiazem QID daily for several days and she hs taken her Flecainide TID for several days without relief... she sent 2 strips for her apple watch.   She has been dizzy but everything improves when she sits and lays her head down and rests.   I made her an appt for the afib clinic for 03/30/22... I advised her if she does not feel well she should not drive..if anything worsens she should call EMS or have someone take her to the ED and she agrees.

## 2022-03-31 ENCOUNTER — Telehealth: Payer: Self-pay

## 2022-03-31 ENCOUNTER — Encounter: Payer: Self-pay | Admitting: Internal Medicine

## 2022-03-31 NOTE — Telephone Encounter (Signed)
Left patient a message regarding scheduling her Atrial Flutter Ablation.  Calling to see if she could do first case Friday, October 20?  Told patient to call or MyChart message me.

## 2022-04-01 ENCOUNTER — Telehealth: Payer: Self-pay

## 2022-04-01 NOTE — Telephone Encounter (Signed)
Pt contacted through [place], A-Flutter Ablation scheduled with Dr. Cristopher Peru on 04/30/22 at 930 am.    Letter sent to patient with instructions.  Carto and Anesthesia requested.

## 2022-04-01 NOTE — Telephone Encounter (Signed)
Left patient message to schedule an A-Flutter Ablation on 04/30/2022 at 930 am with Dr. Lovena Le?  Told patient to call office or MyChart message me.  Will send a MyChart message offering this date and time.

## 2022-04-06 ENCOUNTER — Telehealth: Payer: Self-pay

## 2022-04-06 DIAGNOSIS — I480 Paroxysmal atrial fibrillation: Secondary | ICD-10-CM

## 2022-04-06 DIAGNOSIS — I484 Atypical atrial flutter: Secondary | ICD-10-CM

## 2022-04-06 NOTE — Telephone Encounter (Signed)
Message sent to patient requesting her to wear a 14 day Zio monitor.  Dr. Lovena Le orders shared with patient.    Pt is scheduled for an ablation on 04/30/22.  Follow up required.

## 2022-04-06 NOTE — Telephone Encounter (Signed)
-----   Message from Evans Lance, MD sent at 04/01/2022 10:11 PM EDT ----- Merrilee Seashore, I would like this patient to undergo a 14 day zio. She is scheduled for a redo atrial flutter ablation but I want to be sure that she is not having atrial fib as well before her RFA. GT ----- Message ----- From: Sherran Needs, NP Sent: 03/30/2022  12:00 PM EDT To: Evans Lance, MD

## 2022-04-07 ENCOUNTER — Ambulatory Visit: Payer: 59 | Attending: Internal Medicine

## 2022-04-07 DIAGNOSIS — I480 Paroxysmal atrial fibrillation: Secondary | ICD-10-CM

## 2022-04-07 DIAGNOSIS — I484 Atypical atrial flutter: Secondary | ICD-10-CM

## 2022-04-07 NOTE — Addendum Note (Signed)
Addended by: Oleta Mouse C on: 04/07/2022 08:10 AM   Modules accepted: Orders

## 2022-04-07 NOTE — Telephone Encounter (Signed)
Pt contacted through [place], and she is aware of 14 Day Zio Monitor order by Dr. Cristopher Peru.  Pt understands that she will receive the monitor, and to wear it.   Order is to check for underlying Atrial Fibrillation, prior to A-Flutter Ablation at the end of October.   Order entered, and message sent to Ms. Wells as an Pharmacist, hospital.

## 2022-04-07 NOTE — Progress Notes (Unsigned)
Enrolled for [employer] to mail a ZIO XT long term holter monitor to the patients address on file.  

## 2022-04-08 ENCOUNTER — Other Ambulatory Visit: Payer: Self-pay | Admitting: Internal Medicine

## 2022-04-08 DIAGNOSIS — I484 Atypical atrial flutter: Secondary | ICD-10-CM | POA: Diagnosis not present

## 2022-04-08 DIAGNOSIS — I480 Paroxysmal atrial fibrillation: Secondary | ICD-10-CM | POA: Diagnosis not present

## 2022-04-14 ENCOUNTER — Ambulatory Visit: Payer: 59 | Attending: Internal Medicine

## 2022-04-14 ENCOUNTER — Other Ambulatory Visit: Payer: Self-pay | Admitting: Internal Medicine

## 2022-04-14 DIAGNOSIS — Z01810 Encounter for preprocedural cardiovascular examination: Secondary | ICD-10-CM

## 2022-04-14 NOTE — Progress Notes (Signed)
Pt here to get labs in preparation for A-flutter ablation. Mollie called from up front to get lab orders placed and pt will get done while here. BMET and CBC placed at this time.

## 2022-04-15 LAB — CBC
Hematocrit: 37.8 % (ref 34.0–46.6)
Hemoglobin: 12.9 g/dL (ref 11.1–15.9)
MCH: 33.5 pg — ABNORMAL HIGH (ref 26.6–33.0)
MCHC: 34.1 g/dL (ref 31.5–35.7)
MCV: 98 fL — ABNORMAL HIGH (ref 79–97)
Platelets: 171 10*3/uL (ref 150–450)
RBC: 3.85 x10E6/uL (ref 3.77–5.28)
RDW: 12.5 % (ref 11.7–15.4)
WBC: 4.7 10*3/uL (ref 3.4–10.8)

## 2022-04-15 LAB — BASIC METABOLIC PANEL
BUN/Creatinine Ratio: 18 (ref 12–28)
BUN: 14 mg/dL (ref 8–27)
CO2: 23 mmol/L (ref 20–29)
Calcium: 9.2 mg/dL (ref 8.7–10.3)
Chloride: 104 mmol/L (ref 96–106)
Creatinine, Ser: 0.8 mg/dL (ref 0.57–1.00)
Glucose: 91 mg/dL (ref 70–99)
Potassium: 4.1 mmol/L (ref 3.5–5.2)
Sodium: 142 mmol/L (ref 134–144)
eGFR: 82 mL/min/{1.73_m2} (ref >59)

## 2022-04-19 ENCOUNTER — Telehealth: Payer: Self-pay

## 2022-04-19 NOTE — Telephone Encounter (Signed)
-----   Message from Evans Lance, MD sent at 04/18/2022  8:18 AM EDT ----- Normal labs. No change in treatment.

## 2022-04-27 ENCOUNTER — Encounter: Payer: Self-pay | Admitting: Internal Medicine

## 2022-04-29 NOTE — Pre-Procedure Instructions (Signed)
Attempted to call patient regarding procedure instructions.  Left voicemail on the following items: Arrival time 0730 Nothing to eat or drink after midnight No meds AM of procedure Responsible person to drive you home and stay with you for 24 hrs  Have you missed any doses of anti-coagulant Eliquis- take both doses today, do not take dose in the morning.  If you have missed any doses please let office know as soon as possible.

## 2022-04-30 ENCOUNTER — Other Ambulatory Visit: Payer: Self-pay

## 2022-04-30 ENCOUNTER — Ambulatory Visit (HOSPITAL_COMMUNITY): Payer: 59 | Admitting: Certified Registered Nurse Anesthetist

## 2022-04-30 ENCOUNTER — Ambulatory Visit (HOSPITAL_COMMUNITY)
Admission: RE | Admit: 2022-04-30 | Discharge: 2022-05-01 | Disposition: A | Discharge status: Home / Self Care | Payer: 59 | Attending: Internal Medicine | Admitting: Internal Medicine

## 2022-04-30 ENCOUNTER — Ambulatory Visit (HOSPITAL_BASED_OUTPATIENT_CLINIC_OR_DEPARTMENT_OTHER): Payer: 59 | Admitting: Certified Registered Nurse Anesthetist

## 2022-04-30 ENCOUNTER — Encounter (HOSPITAL_COMMUNITY)
Admission: RE | Disposition: A | Discharge status: Home / Self Care | Payer: 59 | Source: Home / Self Care | Attending: Internal Medicine

## 2022-04-30 DIAGNOSIS — I483 Typical atrial flutter: Secondary | ICD-10-CM | POA: Diagnosis not present

## 2022-04-30 DIAGNOSIS — I4892 Unspecified atrial flutter: Secondary | ICD-10-CM | POA: Diagnosis present

## 2022-04-30 DIAGNOSIS — E039 Hypothyroidism, unspecified: Secondary | ICD-10-CM | POA: Diagnosis not present

## 2022-04-30 DIAGNOSIS — I480 Paroxysmal atrial fibrillation: Secondary | ICD-10-CM | POA: Insufficient documentation

## 2022-04-30 HISTORY — PX: A-FLUTTER ABLATION: EP1230

## 2022-04-30 SURGERY — A-FLUTTER ABLATION
Anesthesia: General

## 2022-04-30 MED ORDER — ONDANSETRON HCL 4 MG/2ML IJ SOLN
INTRAMUSCULAR | Status: DC | PRN
  Administered 2022-04-30: 4 mg via INTRAVENOUS

## 2022-04-30 MED ORDER — SODIUM CHLORIDE 0.9% FLUSH: 3.0000 mL | INTRAVENOUS | Status: DC | PRN

## 2022-04-30 MED ORDER — LEVOTHYROXINE SODIUM 25 MCG PO TABS
12.5000 ug | ORAL_TABLET | Freq: Every day | ORAL | Status: DC
  Administered 2022-05-01: 12.5 ug via ORAL
  Filled 2022-04-30: qty 1

## 2022-04-30 MED ORDER — BUPIVACAINE HCL (PF) 0.25 % IJ SOLN
INTRAMUSCULAR | Status: AC
  Filled 2022-04-30: qty 60

## 2022-04-30 MED ORDER — FENTANYL CITRATE (PF) 250 MCG/5ML IJ SOLN
INTRAMUSCULAR | Status: DC | PRN
  Administered 2022-04-30 (×2): 50 ug via INTRAVENOUS

## 2022-04-30 MED ORDER — HEPARIN SODIUM (PORCINE) 1000 UNIT/ML IJ SOLN
INTRAMUSCULAR | Status: DC | PRN
  Administered 2022-04-30: 1000 [IU] via INTRAVENOUS

## 2022-04-30 MED ORDER — FLECAINIDE ACETATE 50 MG PO TABS
50.0000 mg | ORAL_TABLET | Freq: Two times a day (BID) | ORAL | Status: DC
  Administered 2022-04-30 – 2022-05-01 (×2): 50 mg via ORAL
  Filled 2022-04-30 (×3): qty 1

## 2022-04-30 MED ORDER — SODIUM CHLORIDE 0.9 % IV SOLN: 250.0000 mL | INTRAVENOUS | Status: DC | PRN

## 2022-04-30 MED ORDER — ONDANSETRON HCL 4 MG/2ML IJ SOLN: 4.0000 mg | Freq: Four times a day (QID) | INTRAMUSCULAR | Status: DC | PRN

## 2022-04-30 MED ORDER — SODIUM CHLORIDE 0.9% FLUSH
3.0000 mL | Freq: Two times a day (BID) | INTRAVENOUS | Status: DC
  Administered 2022-04-30: 3 mL via INTRAVENOUS

## 2022-04-30 MED ORDER — METOPROLOL TARTRATE 5 MG/5ML IV SOLN
INTRAVENOUS | Status: DC | PRN
  Administered 2022-04-30: 5 mg via INTRAVENOUS

## 2022-04-30 MED ORDER — ACETAMINOPHEN 500 MG PO TABS
ORAL_TABLET | ORAL | Status: AC
  Administered 2022-04-30: 1000 mg via ORAL
  Filled 2022-04-30: qty 2

## 2022-04-30 MED ORDER — METOPROLOL TARTRATE 5 MG/5ML IV SOLN
INTRAVENOUS | Status: AC
  Filled 2022-04-30: qty 5

## 2022-04-30 MED ORDER — HEPARIN (PORCINE) IN NACL 1000-0.9 UT/500ML-% IV SOLN
INTRAVENOUS | Status: DC | PRN
  Administered 2022-04-30: 500 mL

## 2022-04-30 MED ORDER — LIDOCAINE 2% (20 MG/ML) 5 ML SYRINGE
INTRAMUSCULAR | Status: DC | PRN
  Administered 2022-04-30: 20 mg via INTRAVENOUS

## 2022-04-30 MED ORDER — EPHEDRINE SULFATE (PRESSORS) 50 MG/ML IJ SOLN
INTRAMUSCULAR | Status: DC | PRN
  Administered 2022-04-30 (×2): 2.5 mg via INTRAVENOUS

## 2022-04-30 MED ORDER — ISOPROTERENOL HCL 0.2 MG/ML IJ SOLN
INTRAMUSCULAR | Status: AC
  Filled 2022-04-30: qty 5

## 2022-04-30 MED ORDER — ACETAMINOPHEN 500 MG PO TABS: 1000.0000 mg | ORAL_TABLET | Freq: Once | ORAL | Status: AC

## 2022-04-30 MED ORDER — DILTIAZEM HCL ER COATED BEADS 120 MG PO CP24: 120.0000 mg | ORAL_CAPSULE | Freq: Every day | ORAL | Status: DC | PRN

## 2022-04-30 MED ORDER — DILTIAZEM HCL 30 MG PO TABS: 30.0000 mg | ORAL_TABLET | Freq: Four times a day (QID) | ORAL | Status: DC | PRN

## 2022-04-30 MED ORDER — PROPOFOL 10 MG/ML IV BOLUS
INTRAVENOUS | Status: DC | PRN
  Administered 2022-04-30: 100 mg via INTRAVENOUS

## 2022-04-30 MED ORDER — ADULT MULTIVITAMIN W/MINERALS CH
1.0000 | ORAL_TABLET | Freq: Every day | ORAL | Status: DC
  Administered 2022-05-01: 1 via ORAL
  Filled 2022-04-30: qty 1

## 2022-04-30 MED ORDER — DEXAMETHASONE SODIUM PHOSPHATE 10 MG/ML IJ SOLN
INTRAMUSCULAR | Status: DC | PRN
  Administered 2022-04-30: 5 mg via INTRAVENOUS

## 2022-04-30 MED ORDER — ROCURONIUM BROMIDE 10 MG/ML (PF) SYRINGE
PREFILLED_SYRINGE | INTRAVENOUS | Status: DC | PRN
  Administered 2022-04-30 (×2): 10 mg via INTRAVENOUS
  Administered 2022-04-30: 20 mg via INTRAVENOUS
  Administered 2022-04-30: 30 mg via INTRAVENOUS

## 2022-04-30 MED ORDER — BUPIVACAINE HCL (PF) 0.25 % IJ SOLN
INTRAMUSCULAR | Status: DC | PRN
  Administered 2022-04-30: 20 mL

## 2022-04-30 MED ORDER — SODIUM CHLORIDE 0.9 % IV SOLN: INTRAVENOUS | Status: DC

## 2022-04-30 MED ORDER — SUGAMMADEX SODIUM 200 MG/2ML IV SOLN
INTRAVENOUS | Status: DC | PRN
  Administered 2022-04-30: 200 mg via INTRAVENOUS

## 2022-04-30 MED ORDER — ACETAMINOPHEN 325 MG PO TABS: 650.0000 mg | ORAL_TABLET | ORAL | Status: DC | PRN

## 2022-04-30 MED ORDER — PHENYLEPHRINE HCL-NACL 20-0.9 MG/250ML-% IV SOLN
INTRAVENOUS | Status: DC | PRN
  Administered 2022-04-30: 25 ug/min via INTRAVENOUS

## 2022-04-30 MED ORDER — HEPARIN (PORCINE) IN NACL 1000-0.9 UT/500ML-% IV SOLN
INTRAVENOUS | Status: AC
  Filled 2022-04-30: qty 500

## 2022-04-30 MED ORDER — MIDAZOLAM HCL 5 MG/5ML IJ SOLN
INTRAMUSCULAR | Status: DC | PRN
  Administered 2022-04-30: 2 mg via INTRAVENOUS

## 2022-04-30 MED ORDER — HEPARIN SODIUM (PORCINE) 1000 UNIT/ML IJ SOLN
INTRAMUSCULAR | Status: AC
  Filled 2022-04-30: qty 10

## 2022-04-30 SURGICAL SUPPLY — 14 items
BAG SNAP BAND KOVER 36X36 (MISCELLANEOUS) IMPLANT
BLANKET WARM UNDERBOD FULL ACC (MISCELLANEOUS) IMPLANT
CATH DUODECA HALO/ISMUS 7FR (CATHETERS) IMPLANT
CATH JOSEPH QUAD ALLRED 6F REP (CATHETERS) IMPLANT
CATH POLARIS X REPROCESSED (CATHETERS) IMPLANT
CATH SMTCH THERMOCOOL SF FJ (CATHETERS) IMPLANT
PACK EP LATEX FREE (CUSTOM PROCEDURE TRAY) ×1
PACK EP LF (CUSTOM PROCEDURE TRAY) ×1 IMPLANT
PAD DEFIB RADIO PHYSIO CONN (PAD) ×1 IMPLANT
PATCH CARTO3 (PAD) IMPLANT
SHEATH PINNACLE 6F 10CM (SHEATH) IMPLANT
SHEATH PINNACLE 7F 10CM (SHEATH) IMPLANT
SHEATH PINNACLE 8F 10CM (SHEATH) IMPLANT
TUBING SMART ABLATE COOLFLOW (TUBING) IMPLANT

## 2022-04-30 NOTE — Progress Notes (Signed)
Site area: rt groin Site Prior to Removal:  Level 0 Pressure Applied For: 20 minutes Manual:   yes Patient Status During Pull:  stable Post Pull Site:  Level 0 Post Pull Instructions Given:  yes Post Pull Pulses Present: rt dp palpable Dressing Applied:  gauze and tegaderm Bedrest begins @ 1530 Comments:

## 2022-04-30 NOTE — Transfer of Care (Signed)
Immediate Anesthesia Transfer of Care Note  Patient: Kayla Jacobs  Procedure(s) Performed: A-FLUTTER ABLATION  Patient Location: Cath Lab  Anesthesia Type:General  Level of Consciousness: drowsy and patient cooperative  Airway & Oxygen Therapy: Patient Spontanous Breathing and Patient connected to nasal cannula oxygen  Post-op Assessment: Report given to RN and Post -op Vital signs reviewed and stable  Post vital signs: Reviewed and stable  Last Vitals:  Vitals Value Taken Time  BP 143/88 04/30/22 1500  Temp 36.2 C 04/30/22 1501  Pulse 80 04/30/22 1500  Resp 20 04/30/22 1500  SpO2 97 % 04/30/22 1500  Vitals shown include unvalidated device data.  Last Pain:  Vitals:   04/30/22 1501  TempSrc: Temporal  PainSc: Asleep         Complications: No notable events documented.

## 2022-04-30 NOTE — Anesthesia Postprocedure Evaluation (Signed)
Anesthesia Post Note  Patient: Kayla Jacobs  Procedure(s) Performed: A-FLUTTER ABLATION     Patient location during evaluation: PACU Anesthesia Type: General Level of consciousness: awake and alert Pain management: pain level controlled Vital Signs Assessment: post-procedure vital signs reviewed and stable Respiratory status: spontaneous breathing, nonlabored ventilation, respiratory function stable and patient connected to nasal cannula oxygen Cardiovascular status: blood pressure returned to baseline and stable Postop Assessment: no apparent nausea or vomiting Anesthetic complications: no   No notable events documented.  Last Vitals:  Vitals:   04/30/22 1530 04/30/22 1531  BP: 132/82   Pulse: 87   Resp: 16   Temp:  (!) 36.2 C  SpO2: 92%     Last Pain:  Vitals:   04/30/22 1531  TempSrc: Temporal  PainSc: 0-No pain                 Tiajuana Amass

## 2022-04-30 NOTE — Discharge Instructions (Signed)
Cardiac Ablation, Care After  This sheet gives you information about how to care for yourself after your procedure. Your health care provider may also give you more specific instructions. If you have problems or questions, contact your health care provider. What can I expect after the procedure? After the procedure, it is common to have: Bruising around your puncture site. Tenderness around your puncture site. Skipped heartbeats. Tiredness (fatigue).  Follow these instructions at home: Puncture site care  Follow instructions from your health care provider about how to take care of your puncture site. Make sure you: If present, leave stitches (sutures), skin glue, or adhesive strips in place. These skin closures may need to stay in place for up to 2 weeks. If adhesive strip edges start to loosen and curl up, you may trim the loose edges. Do not remove adhesive strips completely unless your health care provider tells you to do that. If a large square bandage is present, this may be removed 24 hours after surgery.  Check your puncture site every day for signs of infection. Check for: Redness, swelling, or pain. Fluid or blood. If your puncture site starts to bleed, lie down on your back, apply firm pressure to the area, and contact your health care provider. Warmth. Pus or a bad smell. A pea or small marble sized lump at the site is normal and can take up to three months to resolve.  Driving Do not drive for at least 4 days after your procedure or however long your health care provider recommends. (Do not resume driving if you have previously been instructed not to drive for other health reasons.) Do not drive or use heavy machinery while taking prescription pain medicine. Activity Avoid activities that take a lot of effort for at least 7 days after your procedure. Do not lift anything that is heavier than 5 lb (4.5 kg) for one week.  Per Dr. Ladona Ridgel, you may return to work and curling in 1  week. No sexual activity for 1 week.  Return to your normal activities as told by your health care provider. Ask your health care provider what activities are safe for you. General instructions Take over-the-counter and prescription medicines only as told by your health care provider. Do not use any products that contain nicotine or tobacco, such as cigarettes and e-cigarettes. If you need help quitting, ask your health care provider. You may shower after 24 hours, but Do not take baths, swim, or use a hot tub for 1 week.  Do not drink alcohol for 24 hours after your procedure. Keep all follow-up visits as told by your health care provider. This is important. Contact a health care provider if: You have redness, mild swelling, or pain around your puncture site. You have fluid or blood coming from your puncture site that stops after applying firm pressure to the area. Your puncture site feels warm to the touch. You have pus or a bad smell coming from your puncture site. You have a fever. You have chest pain or discomfort that spreads to your neck, jaw, or arm. You are sweating a lot. You feel nauseous. You have a fast or irregular heartbeat. You have shortness of breath. You are dizzy or light-headed and feel the need to lie down. You have pain or numbness in the arm or leg closest to your puncture site. Get help right away if: Your puncture site suddenly swells. Your puncture site is bleeding and the bleeding does not stop after applying firm  pressure to the area. These symptoms may represent a serious problem that is an emergency. Do not wait to see if the symptoms will go away. Get medical help right away. Call your local emergency services (911 in the [place] Do not drive yourself to the hospital. Summary After the procedure, it is normal to have bruising and tenderness at the puncture site in your groin, neck, or forearm. Check your puncture site every day for signs of infection. Get  help right away if your puncture site is bleeding and the bleeding does not stop after applying firm pressure to the area. This is a medical emergency. This information is not intended to replace advice given to you by your health care provider. Make sure you discuss any questions you have with your health care provider.

## 2022-04-30 NOTE — Anesthesia Preprocedure Evaluation (Signed)
Anesthesia Evaluation  Patient identified by MRN, date of birth, ID band Patient awake    Reviewed: Allergy & Precautions, NPO status , Patient's Chart, lab work & pertinent test results  Airway Mallampati: II  TM Distance: >3 FB Neck ROM: Full    Dental  (+) Dental Advisory Given   Pulmonary neg pulmonary ROS,    breath sounds clear to auscultation       Cardiovascular + dysrhythmias Atrial Fibrillation  Rhythm:Regular Rate:Normal     Neuro/Psych negative neurological ROS     GI/Hepatic negative GI ROS, Neg liver ROS,   Endo/Other  Hypothyroidism   Renal/GU negative Renal ROS     Musculoskeletal   Abdominal   Peds  Hematology negative hematology ROS (+)   Anesthesia Other Findings   Reproductive/Obstetrics                             Lab Results  Component Value Date   WBC 4.7 04/14/2022   HGB 12.9 04/14/2022   HCT 37.8 04/14/2022   MCV 98 (H) 04/14/2022   PLT 171 04/14/2022   Lab Results  Component Value Date   CREATININE 0.80 04/14/2022   BUN 14 04/14/2022   NA 142 04/14/2022   K 4.1 04/14/2022   CL 104 04/14/2022   CO2 23 04/14/2022    Anesthesia Physical Anesthesia Plan  ASA: 2  Anesthesia Plan: General   Post-op Pain Management: Tylenol PO (pre-op)*   Induction: Intravenous  PONV Risk Score and Plan: 3 and Dexamethasone, Ondansetron and Treatment may vary due to age or medical condition  Airway Management Planned: Oral ETT  Additional Equipment: None  Intra-op Plan:   Post-operative Plan: Extubation in OR  Informed Consent: I have reviewed the patients History and Physical, chart, labs and discussed the procedure including the risks, benefits and alternatives for the proposed anesthesia with the patient or authorized representative who has indicated his/her understanding and acceptance.     Dental advisory given  Plan Discussed with: CRNA  Anesthesia  Plan Comments:         Anesthesia Quick Evaluation

## 2022-04-30 NOTE — Anesthesia Procedure Notes (Signed)
Procedure Name: Intubation Date/Time: 04/30/2022 12:05 PM  Performed by: Glynda Jaeger, CRNAPre-anesthesia Checklist: Patient identified, Patient being monitored, Timeout performed, Emergency Drugs available and Suction available Patient Re-evaluated:Patient Re-evaluated prior to induction Oxygen Delivery Method: Circle System Utilized Preoxygenation: Pre-oxygenation with 100% oxygen Induction Type: IV induction Ventilation: Mask ventilation without difficulty Laryngoscope Size: Mac and 4 Grade View: Grade I Tube type: Oral Tube size: 7.0 mm Number of attempts: 1 Airway Equipment and Method: Stylet Placement Confirmation: ETT inserted through vocal cords under direct vision, positive ETCO2 and breath sounds checked- equal and bilateral Secured at: 20 cm Tube secured with: Tape Dental Injury: Teeth and Oropharynx as per pre-operative assessment

## 2022-04-30 NOTE — H&P (Signed)
HPI [name] returns today for followup. The patient is a pleasant 66 yo man with a h/o atrial flutter, s/p catheter ablation where she had PAF during the procedure and occaisional palpitations. She was seen in the office several weeks ago and was found to be in atrial flutter with an atrial rate of 150/min and 2:1 conduction. She spontaneously reverted spontaneously to NSR. Since then she has felt well.  No Known Allergies           Current Outpatient Medications  Medication Sig Dispense Refill   apixaban (ELIQUIS) 5 MG TABS tablet Take 1 tablet (5 mg total) by mouth 2 (two) times daily. 60 tablet 11   Ascorbic Acid (VITAMIN C) 500 MG CHEW Chew 500 mg by mouth daily.       Calcium Carbonate-Vitamin D (CALCIUM-D PO) Take 1 tablet by mouth once a week.       diltiazem (CARDIZEM) 30 MG tablet Take 1 tablet (30 mg total) by mouth 4 (four) times daily as needed (heart racing). 60 tablet 1   flecainide (TAMBOCOR) 50 MG tablet Take 1 tablet (50 mg total) by mouth 2 (two) times daily. 180 tablet 3   levothyroxine (SYNTHROID) 25 MCG tablet Take 12.5 mcg by mouth every morning.       Multiple Vitamins-Minerals (ADULT GUMMY PO) Take 2 tablets by mouth daily. Vitafusion Gummy Vitamins for Women-       aspirin EC 81 MG tablet Take 1 tablet (81 mg total) by mouth daily. (Patient not taking: Reported on 02/23/2022) 90 tablet 3    No current facility-administered medications for this visit.            Past Medical History:  Diagnosis Date   Cancer ([place])      basal cell skin cancer   Osteopenia        ROS:    All systems reviewed and negative except as noted in the HPI.          Past Surgical History:  Procedure Laterality Date   A-FLUTTER ABLATION N/A 04/06/2019    Procedure: A-FLUTTER ABLATION;  Surgeon: Evans Lance, MD;  Location: [facility];  Service: Cardiovascular;  Laterality: N/A;   [place] SURGERY   2005    POLYPECTOMY       TONSILLECTOMY   1962             Family History  Problem Relation Age of Onset   Esophageal cancer Father     Colon cancer Neg Hx     Stomach cancer Neg Hx     Colon polyps Neg Hx     Rectal cancer Neg Hx          Social History         Socioeconomic History   Marital status: Married      Spouse name: Not on file   Number of children: Not on file   Years of education: Not on file   Highest education level: Not on file  Occupational History   Not on file  Tobacco Use   Smoking status: Never   Smokeless tobacco: Never  Substance and Sexual Activity   Alcohol use: Yes      Alcohol/week: 7.0 standard drinks of alcohol      Types: 7 Glasses of wine per week   Drug use: No  Sexual activity: Not on file  Other Topics Concern   Not on file  Social History Narrative   Not on file    Social Determinants of Health    Financial Resource Strain: Not on file  Food Insecurity: Not on file  Transportation Needs: Not on file  Physical Activity: Not on file  Stress: Not on file  Social Connections: Not on file  Intimate Partner Violence: Not on file        BP 100/66   Pulse 60   Ht 5' 5.5" (1.664 m)   Wt 108 lb (49 kg)   SpO2 97%   BMI 17.70 kg/m    Physical Exam:   Well appearing NAD HEENT: Unremarkable Neck:  No JVD, no thyromegally Lymphatics:  No adenopathy Back:  No CVA tenderness Lungs:  Clear with no wheezes HEART:  Regular rate rhythm, no murmurs, no rubs, no clicks Abd:  soft, positive bowel sounds, no organomegally, no rebound, no guarding Ext:  2 plus pulses, no edema, no cyanosis, no clubbing Skin:  No rashes no nodules Neuro:  CN II through XII intact, motor grossly intact   Assess/Plan:  Atrial flutter - I have discussed the treatment options with the patient. I have offered her repeat ablation. She will callus if she wishes to proceed. Would use an irrigated catheter and carto and anesthesia. PAF - she has not had any  documented atrial fib on low dose flecainide.  Coags - she has been placed back on eliquis. If she undergoes ablation I would consider stopping the eliquis.    Kayla Overlie Man Effertz,MD

## 2022-05-01 ENCOUNTER — Encounter: Payer: Self-pay | Admitting: Internal Medicine

## 2022-05-01 DIAGNOSIS — E039 Hypothyroidism, unspecified: Secondary | ICD-10-CM | POA: Diagnosis not present

## 2022-05-01 DIAGNOSIS — I484 Atypical atrial flutter: Secondary | ICD-10-CM

## 2022-05-01 DIAGNOSIS — I483 Typical atrial flutter: Secondary | ICD-10-CM | POA: Diagnosis not present

## 2022-05-01 DIAGNOSIS — I480 Paroxysmal atrial fibrillation: Secondary | ICD-10-CM | POA: Diagnosis not present

## 2022-05-01 MED ORDER — FLECAINIDE ACETATE 50 MG PO TABS: ORAL_TABLET | ORAL | 1 refills | Status: DC

## 2022-05-01 NOTE — Progress Notes (Signed)
Progress Note  Patient Name: Kayla Jacobs Date of Encounter: 05/01/2022  Primary Cardiologist: None   Subjective   Stable this morning. She can tell that she is out of rhythm but feels well.   Inpatient Medications    Scheduled Meds:  flecainide  50 mg Oral BID   levothyroxine  12.5 mcg Oral QAC breakfast   multivitamin with minerals  1 tablet Oral Daily   sodium chloride flush  3 mL Intravenous Q12H   Continuous Infusions:  sodium chloride     PRN Meds: sodium chloride, acetaminophen, diltiazem, ondansetron (ZOFRAN) IV, sodium chloride flush   Vital Signs    Vitals:   04/30/22 2100 04/30/22 2130 05/01/22 0015 05/01/22 0512  BP: 106/79 1'08/75 99/63 97/76 '$  Pulse: 94 94 91 95  Resp: '18 15 15 15  '$ Temp:   97.7 F (36.5 C) 97.8 F (36.6 C)  TempSrc:   Oral Oral  SpO2: 97% 99% 99% 99%  Weight:   49.9 kg 49.7 kg  Height:   '5\' 6"'$  (1.676 m)     Intake/Output Summary (Last 24 hours) at 05/01/2022 0903 Last data filed at 04/30/2022 1820 Gross per 24 hour  Intake 1000 ml  Output 860 ml  Net 140 ml   Filed Weights   04/30/22 0751 05/01/22 0015 05/01/22 0512  Weight: 49 kg 49.9 kg 49.7 kg    Telemetry    Atrial flutter with 2:1 AV conduction - Personally Reviewed  ECG    Atypical atrial flutter - Personally Reviewed  Physical Exam   GEN: No acute distress.   Neck: No JVD Cardiac: RRR, no murmurs, rubs, or gallops.  Respiratory: Clear to auscultation bilaterally. GI: Soft, nontender, non-distended  MS: No edema; No deformity. Neuro:  Nonfocal  Psych: Normal affect   Labs    ChemistryNo results for input(s): "NA", "K", "CL", "CO2", "GLUCOSE", "BUN", "CREATININE", "CALCIUM", "PROT", "ALBUMIN", "AST", "ALT", "ALKPHOS", "BILITOT", "GFRNONAA", "GFRAA", "ANIONGAP" in the last 168 hours.   HematologyNo results for input(s): "WBC", "RBC", "HGB", "HCT", "MCV", "MCH", "MCHC", "RDW", "PLT" in the last 168 hours.  Cardiac EnzymesNo results for input(s):  "TROPONINI" in the last 168 hours. No results for input(s): "TROPIPOC" in the last 168 hours.   BNPNo results for input(s): "BNP", "PROBNP" in the last 168 hours.   DDimer No results for input(s): "DDIMER" in the last 168 hours.   Radiology    EP STUDY  Result Date: 04/30/2022 Conclusion: Apparent successful EP study and catheter ablation of typical atrial flutter which recurred after initial atrial flutter ablation years ago.  At the end of the ablation procedure after atrial flutter had been terminated, atrial fibrillation developed and was incessant though it would spontaneously terminate but then immediately return.  Additional assessment of the atrial flutter isthmus could not be carried out secondary to the patient's atrial fibrillation following termination of the atrial flutter with RF energy application. Crissie Sickles, MD    Cardiac Studies   none  Patient Profile     66 y.o. female admitted for EP study and ablation. She developed atrial fib and now LA flutter (neg flutter waves in inferior leads and V1).   Assessment & Plan    Typical atrial flutter - s/p ablation. Atrial fib - continue flecainide Atypical flutter - continue flecainide with increase in dose to 100 mg in a.m and 50 mg in p.m. She will take cardizem prn. Coags - she is CHADSVASC score of 2 but one being female. For now I would  like her to stay on eliquis.  Disp - ok to DC home. Followup with me in3-4 weeks in [place] office.  For questions or updates, please contact [place] Please consult [email or web address] for contact info under Cardiology/STEMI.   Signed, Cristopher Peru, MD  05/01/2022, 9:03 AM

## 2022-05-01 NOTE — Progress Notes (Signed)
Nsg Discharge Note  Admit Date:  04/30/2022 Discharge date: 05/01/2022   Kayla Jacobs to be D/C'd Home per MD order.  AVS completed.   Patient/caregiver able to verbalize understanding.  Discharge Medication: Allergies as of 05/01/2022       Reactions   Macrobid [nitrofurantoin] Swelling   Burning sensation and swelling        Medication List     TAKE these medications    apixaban 5 MG Tabs tablet Commonly known as: ELIQUIS Take 1 tablet (5 mg total) by mouth 2 (two) times daily.   CALCIUM-D PO Take 1 tablet by mouth daily.   diltiazem 120 MG 24 hr capsule Commonly known as: CARDIZEM CD Take 120 mg by mouth daily as needed (a-fib).   diltiazem 30 MG tablet Commonly known as: Cardizem Take 1 tablet (30 mg total) by mouth 4 (four) times daily as needed (heart racing).   flecainide 50 MG tablet Commonly known as: TAMBOCOR Take 2 tablets (100 mg) by mouth every morning and 1 tablet (50 mg) every evening. What changed:  how much to take how to take this when to take this additional instructions   levothyroxine 25 MCG tablet Commonly known as: SYNTHROID Take 12.5 mcg by mouth every morning.   multivitamin with minerals Tabs tablet Take 1 tablet by mouth daily.        Discharge Assessment: Vitals:   05/01/22 0512 05/01/22 1013  BP: 97/76 98/68  Pulse: 95 61  Resp: 15 20  Temp: 97.8 F (36.6 C) 98.7 F (37.1 C)  SpO2: 99% 100%   Skin clean, dry and intact without evidence of skin break down, no evidence of skin tears noted. IV catheter discontinued intact. Site without signs and symptoms of complications - no redness or edema noted at insertion site, patient denies c/o pain - only slight tenderness at site.  Dressing with slight pressure applied.  D/c Instructions-Education: Discharge instructions given to patient/family with verbalized understanding. D/c education completed with patient/family including follow up instructions, medication list, d/c  activities limitations if indicated, with other d/c instructions as indicated by MD - patient able to verbalize understanding, all questions fully answered. Patient instructed to return to ED, call 911, or call MD for any changes in condition.  Patient escorted via [place], and D/C home via private auto.  [name], [name], RN 05/01/2022 12:12 PM

## 2022-05-01 NOTE — Plan of Care (Signed)
  Problem: Health Behavior/ Discharge Planning: Goal: Ability to safely manage health related needs after discharge Outcome: Adequate for Discharge, spouse will be picking her up today

## 2022-05-01 NOTE — Discharge Summary (Addendum)
Discharge Summary    Patient ID: Kayla Jacobs MRN: [id number]; DOB: 1956/01/22  Admit date: 04/30/2022 Discharge date: 05/01/2022  PCP:  Jonathon Jordan, [place] Providers Cardiologist:  None        Discharge Diagnoses    Principal Problem:   Atrial flutter [facility])    Diagnostic Studies/Procedures    A-Flutter Ablation 04/30/22 - please see EMR for full report Conclusion: Apparent successful EP study and catheter ablation of typical atrial flutter which recurred after initial atrial flutter ablation years ago.  At the end of the ablation procedure after atrial flutter had been terminated, atrial fibrillation developed and was incessant though it would spontaneously terminate but then immediately return.  Additional assessment of the atrial flutter isthmus could not be carried out secondary to the patient's atrial fibrillation following termination of the atrial flutter with RF energy application. Cristopher Peru, MD  _____________   History of Present Illness     Kayla Jacobs is a 66 y.o. female with atrial flutter s/p prior ablation, PAF, hypothyroidism, RBBB who presented to the hospital for planned atrial flutter ablation. She was recently followed int he office and was found to be in atrial flutter with an atrial rate of 150/min and 2:1 conduction. She spontaneously reverted spontaneously to NSR. Dr. Lovena Le discussed options with her and offered repeat ablation, which she ultimately elected to pursue.   Hospital Course     She underwent EP study and catheter ablation of typical atrial flutter. At the end of the ablation procedure after atrial flutter had been terminated, atrial fibrillation developed and was incessant though it would spontaneously terminate but then immediately return.  Additional assessment of the atrial flutter isthmus could not be carried out secondary to the patient's atrial fibrillation following termination of the atrial flutter with RF  energy application. Dr. Lovena Le recommended to increase flecainide to '100mg'$  QAM and '50mg'$  QPM, with diltiazen PRN, and to continue Eliquis. He reports the patient does not need a standing dose of diltiazem, but historically knows to take her short acting and long acting diltiazem when recurrent palpitations arise. The patient has scheduled follow-up on 06/03/22. He reports she can otherwise return to normal activities/work in a week. Dr. Lovena Le has seen and examined the patient today and feels she is stable for discharge. Post-procedure care has been outlined on AVS.      Did the patient have an acute coronary syndrome (MI, NSTEMI, STEMI, etc) this admission?:  No                               Did the patient have a percutaneous coronary intervention (stent / angioplasty)?:  No.   _____________  Discharge Vitals Blood pressure 98/68, pulse 61, temperature 98.7 F (37.1 C), temperature source Oral, resp. rate 20, height '5\' 6"'$  (1.676 m), weight 49.7 kg, SpO2 100 %.  Filed Weights   04/30/22 0751 05/01/22 0015 05/01/22 0512  Weight: 49 kg 49.9 kg 49.7 kg    Labs & Radiologic Studies     EP STUDY  Result Date: 04/30/2022 Conclusion: Apparent successful EP study and catheter ablation of typical atrial flutter which recurred after initial atrial flutter ablation years ago.  At the end of the ablation procedure after atrial flutter had been terminated, atrial fibrillation developed and was incessant though it would spontaneously terminate but then immediately return.  Additional assessment of the atrial flutter isthmus could not  be carried out secondary to the patient's atrial fibrillation following termination of the atrial flutter with RF energy application. Crissie Sickles, MD   Disposition   Pt is being discharged home today in good condition.  Follow-up Plans & Appointments     Follow-up Information     Evans Lance, MD Follow up.   Specialty: Cardiology Why: Keep follow-up as scheduled  on Thursday Jun 03, 2022 at 4:15 PM (Arrive by 4:00 PM). Contact information: [address] [address] [phone]                Discharge Instructions     Diet - low sodium heart healthy   Complete by: As directed    Increase activity slowly   Complete by: As directed    Please see end of your After-Visit Summary for instructions on post-procedure care and activity.        Discharge Medications   Allergies as of 05/01/2022       Reactions   Macrobid [nitrofurantoin] Swelling   Burning sensation and swelling        Medication List     TAKE these medications    apixaban 5 MG Tabs tablet Commonly known as: ELIQUIS Take 1 tablet (5 mg total) by mouth 2 (two) times daily.   CALCIUM-D PO Take 1 tablet by mouth daily.   diltiazem 120 MG 24 hr capsule Commonly known as: CARDIZEM CD Take 120 mg by mouth daily as needed (a-fib).   diltiazem 30 MG tablet Commonly known as: Cardizem Take 1 tablet (30 mg total) by mouth 4 (four) times daily as needed (heart racing).   flecainide 50 MG tablet Commonly known as: TAMBOCOR Take 2 tablets (100 mg) by mouth every morning and 1 tablet (50 mg) every evening. What changed:  how much to take how to take this when to take this additional instructions   levothyroxine 25 MCG tablet Commonly known as: SYNTHROID Take 12.5 mcg by mouth every morning.   multivitamin with minerals Tabs tablet Take 1 tablet by mouth daily.           Outstanding Labs/Studies   N/A  Duration of Discharge Encounter   Greater than 30 minutes including physician time.  Signed, Charlie Pitter, PA-C 05/01/2022, 10:15 AM  Salome Spotted

## 2022-05-03 ENCOUNTER — Encounter (HOSPITAL_COMMUNITY): Payer: Self-pay | Admitting: Internal Medicine

## 2022-05-03 ENCOUNTER — Telehealth: Payer: Self-pay | Admitting: Internal Medicine

## 2022-05-03 NOTE — Telephone Encounter (Signed)
Zio uploaded and take to Dr Lovena Le... pt had Ablation 04/30/22.

## 2022-05-03 NOTE — Telephone Encounter (Signed)
Calling with abnormal Zio Monitor results. Call transferred

## 2022-05-12 NOTE — Telephone Encounter (Signed)
-----   Message from Evans Lance, MD sent at 05/07/2022 11:05 PM EDT ----- This monitor is know by me. She had atrial flutter, both 1:1 and 2:1. She is s/p ablation. She has followup scheduled.

## 2022-06-03 ENCOUNTER — Encounter: Payer: Self-pay | Admitting: Internal Medicine

## 2022-06-03 ENCOUNTER — Ambulatory Visit: Payer: 59 | Attending: Internal Medicine | Admitting: Internal Medicine

## 2022-06-03 VITALS — BP 110/66 | HR 59 | Ht 65.5 in | Wt 110.0 lb

## 2022-06-03 DIAGNOSIS — I484 Atypical atrial flutter: Secondary | ICD-10-CM

## 2022-06-03 DIAGNOSIS — I480 Paroxysmal atrial fibrillation: Secondary | ICD-10-CM | POA: Diagnosis not present

## 2022-06-03 NOTE — Patient Instructions (Addendum)
Medication Instructions:  Your physician recommends that you continue on your current medications as directed. Please refer to the Current Medication list given to you today.  *If you need a refill on your cardiac medications before your next appointment, please call your pharmacy*  Lab Work: None ordered.  If you have labs (blood work) drawn today and your tests are completely normal, you will receive your results only by: [place] (if you have MyChart) OR A paper copy in the mail If you have any lab test that is abnormal or we need to change your treatment, we will call you to review the results.  Testing/Procedures: None ordered.  Follow-Up: At [facility], you and your health needs are our priority.  As part of our continuing mission to provide you with exceptional heart care, we have created designated Provider Care Teams.  These Care Teams include your primary Cardiologist (physician) and Advanced Practice Providers (APPs -  Physician Assistants and Nurse Practitioners) who all work together to provide you with the care you need, when you need it.  We recommend signing up for the patient portal called "MyChart".  Sign up information is provided on this After Visit Summary.  MyChart is used to connect with patients for Virtual Visits (Telemedicine).  Patients are able to view lab/test results, encounter notes, upcoming appointments, etc.  Non-urgent messages can be sent to your provider as well.   To learn more about what you can do with MyChart, go to [email or web address].    Your next appointment:   Please schedule a 4 month follow up appointment with Dr. Cristopher Peru.  The format for your next appointment:   In Person  Provider:   Cristopher Peru, MD{or one of the following Advanced Practice Providers on your designated Care Team:   Tommye Standard, [place] Legrand Como "Jonni Sanger" Chalmers Cater, [place]   Important Information About Sugar      Please

## 2022-06-03 NOTE — Progress Notes (Signed)
HPI Kayla Jacobs returns today for followup. She is a pleasant 66 yo woman with recurrent atrial flutter who underwent catheter ablation a couple of years ago where during her procedure she had incessant atrial fib despite never having atrial fib and then had a late recurrence of a slower atrial flutter that turned out to be isthmus dependent flutter with 2:1 AV conduction. She underwent repeat ablation with CARTO and anesthesia and her flutter terminated with ablation the she had again had incessant atrial fib making her assessment of block in the flutter isthmus more difficult. We shocked her and she immediately returned to fib. She notes an hour and a 4 hour episode where she felt like her heart went out of rhythm and she took an extra flecainide in addition to 50 mg bid and felt better. No syncope or chest pain.  Allergies  Allergen Reactions   Macrobid [Nitrofurantoin] Swelling    Burning sensation and swelling     Current Outpatient Medications  Medication Sig Dispense Refill   apixaban (ELIQUIS) 5 MG TABS tablet Take 1 tablet (5 mg total) by mouth 2 (two) times daily. 60 tablet 11   Calcium Carbonate-Vitamin D (CALCIUM-D PO) Take 1 tablet by mouth daily.     diltiazem (CARDIZEM CD) 120 MG 24 hr capsule Take 120 mg by mouth daily as needed (a-fib).     diltiazem (CARDIZEM) 30 MG tablet Take 1 tablet (30 mg total) by mouth 4 (four) times daily as needed (heart racing). 60 tablet 1   flecainide (TAMBOCOR) 50 MG tablet Take 2 tablets (100 mg) by mouth every morning and 1 tablet (50 mg) every evening. 270 tablet 1   levothyroxine (SYNTHROID) 25 MCG tablet Take 12.5 mcg by mouth every morning.     Multiple Vitamin (MULTIVITAMIN WITH MINERALS) TABS tablet Take 1 tablet by mouth daily.     No current facility-administered medications for this visit.     Past Medical History:  Diagnosis Date   Cancer ([place])    basal cell skin cancer   Osteopenia     ROS:   All systems reviewed  and negative except as noted in the HPI.   Past Surgical History:  Procedure Laterality Date   A-FLUTTER ABLATION N/A 04/06/2019   Procedure: A-FLUTTER ABLATION;  Surgeon: Evans Lance, MD;  Location: [place] [facility];  Service: Cardiovascular;  Laterality: N/A;   A-FLUTTER ABLATION N/A 04/30/2022   Procedure: A-FLUTTER ABLATION;  Surgeon: Evans Lance, MD;  Location: [place] [facility];  Service: Cardiovascular;  Laterality: N/A;   [place] SURGERY  2005   POLYPECTOMY     TONSILLECTOMY  1962     Family History  Problem Relation Age of Onset   Esophageal cancer Father    Colon cancer Neg Hx    Stomach cancer Neg Hx    Colon polyps Neg Hx    Rectal cancer Neg Hx      Social History   Socioeconomic History   Marital status: Married    Spouse name: Not on file   Number of children: Not on file   Years of education: Not on file   Highest education level: Not on file  Occupational History   Not on file  Tobacco Use   Smoking status: Never   Smokeless tobacco: Never  Substance and Sexual Activity   Alcohol use: Yes    Alcohol/week: 7.0 standard drinks  of alcohol    Types: 7 Glasses of wine per week   Drug use: No   Sexual activity: Not on file  Other Topics Concern   Not on file  Social History Narrative   Not on file   Social Determinants of Health   Financial Resource Strain: Not on file  Food Insecurity: Not on file  Transportation Needs: Not on file  Physical Activity: Not on file  Stress: Not on file  Social Connections: Not on file  Intimate Partner Violence: Not on file     BP 110/66   Pulse (!) 59   Ht 5' 5.5" (1.664 m)   Wt 110 lb (49.9 kg)   SpO2 98%   BMI 18.03 kg/m   Physical Exam:  Well appearing NAD HEENT: Unremarkable Neck:  No JVD, no thyromegally Lymphatics:  No adenopathy Back:  No CVA tenderness Lungs:  Clear with no wheezes HEART:  Regular rate rhythm, no murmurs, no rubs,  no clicks Abd:  soft, positive bowel sounds, no organomegally, no rebound, no guarding Ext:  2 plus pulses, no edema, no cyanosis, no clubbing Skin:  No rashes no nodules Neuro:  CN II through XII intact, motor grossly intact  EKG NSR  Assess/Plan: Atrial flutter - her symptoms are controlled and hopefully it will not return. Her case limited by incessant atrial fib after RFA back to NSR.  Atrial fib - she has only had this during RF. Hopefully we can stop her anti-coag but will hold off on stopping eliquis for now.  Kayla Jacobs Kayla Koren,MD

## 2022-08-12 ENCOUNTER — Encounter (HOSPITAL_COMMUNITY): Payer: Self-pay | Admitting: *Deleted

## 2022-09-30 ENCOUNTER — Encounter: Payer: Self-pay | Admitting: Internal Medicine

## 2022-10-05 ENCOUNTER — Ambulatory Visit: Payer: 59 | Admitting: Internal Medicine

## 2022-12-30 ENCOUNTER — Other Ambulatory Visit: Payer: Self-pay | Admitting: Internal Medicine

## 2023-01-04 ENCOUNTER — Ambulatory Visit: Payer: 59 | Attending: Internal Medicine | Admitting: Internal Medicine

## 2023-01-04 ENCOUNTER — Encounter: Payer: Self-pay | Admitting: Internal Medicine

## 2023-01-04 VITALS — BP 116/70 | HR 62 | Ht 65.5 in | Wt 110.0 lb

## 2023-01-04 DIAGNOSIS — I484 Atypical atrial flutter: Secondary | ICD-10-CM

## 2023-01-04 NOTE — Progress Notes (Signed)
HPI [name] returns today for followup. She is a pleasant 67 yo woman with recurrent atrial flutter who underwent catheter ablation a couple of years ago where during her procedure she had incessant atrial fib despite never having atrial fib and then had a late recurrence of a slower atrial flutter that turned out to be isthmus dependent flutter with 2:1 AV conduction. She underwent repeat ablation with CARTO and anesthesia and her flutter terminated with ablation the she had again had incessant atrial fib making her assessment of block in the flutter isthmus more difficult. We shocked her and she immediately returned to fib. She feels better and has had minimal palpitations. No syncope or chest pain. She is pending a trip to the [address]  Allergies  Allergen Reactions   Macrobid [Nitrofurantoin] Swelling    Burning sensation and swelling     Current Outpatient Medications  Medication Sig Dispense Refill   apixaban (ELIQUIS) 5 MG TABS tablet Take 1 tablet (5 mg total) by mouth 2 (two) times daily. 60 tablet 11   Calcium Carbonate-Vitamin D (CALCIUM-D PO) Take 1 tablet by mouth daily.     diltiazem (CARDIZEM CD) 120 MG 24 hr capsule Take 120 mg by mouth daily as needed (a-fib).     diltiazem (CARDIZEM) 30 MG tablet Take 1 tablet (30 mg total) by mouth 4 (four) times daily as needed (heart racing). 60 tablet 1   flecainide (TAMBOCOR) 50 MG tablet Take 50 mg by mouth 2 (two) times daily.     levothyroxine (SYNTHROID) 25 MCG tablet Take 12.5 mcg by mouth every morning.     Multiple Vitamin (MULTIVITAMIN WITH MINERALS) TABS tablet Take 1 tablet by mouth daily.     No current facility-administered medications for this visit.     Past Medical History:  Diagnosis Date   Cancer ([facility])    basal cell skin cancer   Osteopenia     ROS:   All systems reviewed and negative except as noted in the HPI.   Past Surgical History:  Procedure Laterality Date   A-FLUTTER ABLATION  N/A 04/06/2019   Procedure: A-FLUTTER ABLATION;  Surgeon: Marinus Maw, MD;  Location: [facility];  Service: Cardiovascular;  Laterality: N/A;   A-FLUTTER ABLATION N/A 04/30/2022   Procedure: A-FLUTTER ABLATION;  Surgeon: Marinus Maw, MD;  Location: [facility] INVASIVE CV LAB;  Service: Cardiovascular;  Laterality: N/A;   CESAREAN SECTION  1988   COLONOSCOPY     LUMBAR DISC SURGERY  2005   POLYPECTOMY     TONSILLECTOMY  1962     Family History  Problem Relation Age of Onset   Esophageal cancer Father    Colon cancer Neg Hx    Stomach cancer Neg Hx    Colon polyps Neg Hx    Rectal cancer Neg Hx      Social History   Socioeconomic History   Marital status: Married    Spouse name: Not on file   Number of children: Not on file   Years of education: Not on file   Highest education level: Not on file  Occupational History   Not on file  Tobacco Use   Smoking status: Never   Smokeless tobacco: Never  Substance and Sexual Activity   Alcohol use: Yes    Alcohol/week: 7.0 standard drinks of alcohol    Types: 7 Glasses of wine per week   Drug use: No   Sexual activity: Not on file  Other Topics Concern   Not on file  Social History Narrative   Not on file   Social Determinants of Health   Financial Resource Strain: Not on file  Food Insecurity: Not on file  Transportation Needs: Not on file  Physical Activity: Not on file  Stress: Not on file  Social Connections: Not on file  Intimate Partner Violence: Not on file     BP 116/70   Pulse 62   Ht 5' 5.5" (1.664 m)   Wt 110 lb (49.9 kg)   SpO2 99%   BMI 18.03 kg/m   Physical Exam:  Well appearing 67yo woman, NAD HEENT: Unremarkable Neck:  No JVD, no thyromegally Lymphatics:  No adenopathy Back:  No CVA tenderness Lungs:  Clear with no wheezes HEART:  Regular rate rhythm, no murmurs, no rubs, no clicks Abd:  soft, positive bowel sounds, no organomegally, no rebound, no guarding Ext:  2 plus pulses, no  edema, no cyanosis, no clubbing Skin:  No rashes no nodules Neuro:  CN II through XII intact, motor grossly intact  Assess/Plan:  Atrial flutter - her symptoms are controlled and hopefully it will not return. Her case limited by incessant atrial fib after RFA back to NSR.  Atrial fib - she has only had this during RF. Hopefully we can stop her anti-coag but will hold off on stopping eliquis for now. There is a study ongoing looking at wearable device diagnosing atrial fib and holding OAC and then starting once PAF occurs. She will continue her [facility] for now.    Kayla Jacobs Kayla Hemp,MD

## 2023-01-04 NOTE — Patient Instructions (Signed)
Medication Instructions:  Your physician recommends that you continue on your current medications as directed. Please refer to the Current Medication list given to you today.  *If you need a refill on your cardiac medications before your next appointment, please call your pharmacy*  Follow-Up: At [facility], you and your health needs are our priority.  As part of our continuing mission to provide you with exceptional heart care, we have created designated Provider Care Teams.  These Care Teams include your primary Cardiologist (physician) and Advanced Practice Providers (APPs -  Physician Assistants and Nurse Practitioners) who all work together to provide you with the care you need, when you need it.  Your next appointment:   1 year(s)  Provider:   You may see Gregg Taylor, MD or one of the following Advanced Practice Providers on your designated Care Team:   Renee Ursuy, PA-C Michael "Andy" Tillery, PA-C Suzann Riddle, NP Brandi Ollis, NP    

## 2023-01-14 ENCOUNTER — Encounter: Payer: Self-pay | Admitting: Internal Medicine

## 2023-01-14 ENCOUNTER — Other Ambulatory Visit: Payer: Self-pay | Admitting: Physician Assistant

## 2023-01-14 NOTE — Telephone Encounter (Signed)
Prescription refill request for Eliquis received. Indication:afib Last office visit:7/24 Scr:0.80  10/23 Age: 67 Weight:49.9  kg  Prescription refilled

## 2023-02-28 IMAGING — CT CT ANKLE*R* W/O CM
3 series · 12 of 35 positions shown, 14 images · non-contrast
Comparison: MR ankle 11/10/2021

CLINICAL DATA: Right lateral ankle pain and swelling. Third of 5
months ago. No new injury.

EXAM:
CT OF THE RIGHT ANKLE WITHOUT CONTRAST
TECHNIQUE: Multidetector CT imaging of the right ankle was performed according
to the standard protocol. Multiplanar CT image reconstructions were
also generated.
RADIATION DOSE REDUCTION: This exam was performed according to the
departmental dose-optimization program which includes automated
exposure control, adjustment of the mA and/or kV according to
patient size and/or use of iterative reconstruction technique.

[Series 5: sfov lower extremity 2.00 br40 s3 soft · axial · 0.19mm/px · z∈[+596,+709]mm · 4 of 83 slices shown, 5 images (1 of 3)]
[im 13/83  soft-tissue]
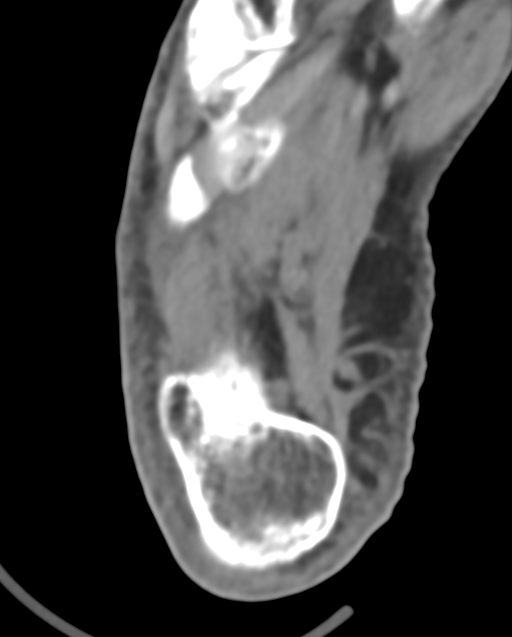
[im 13/83  bone]
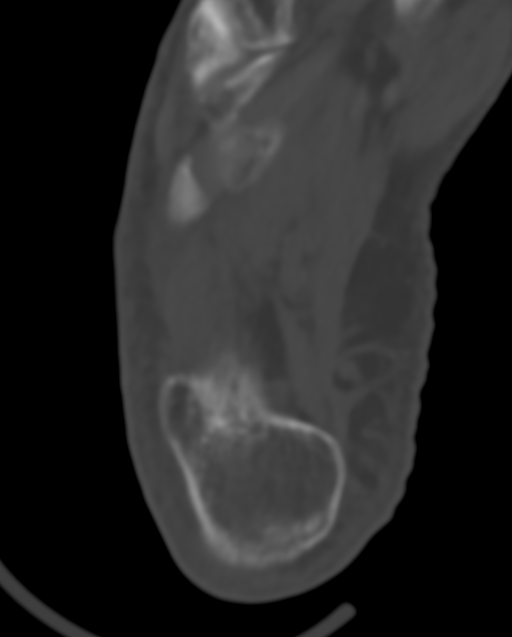
[im 32/83  bone]
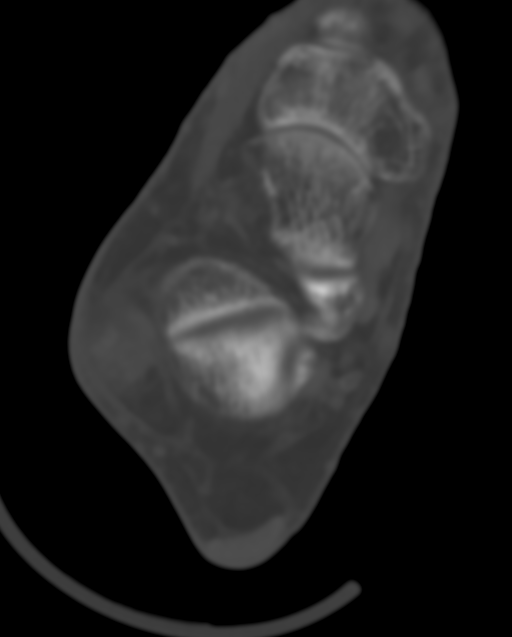
[im 51/83  bone]
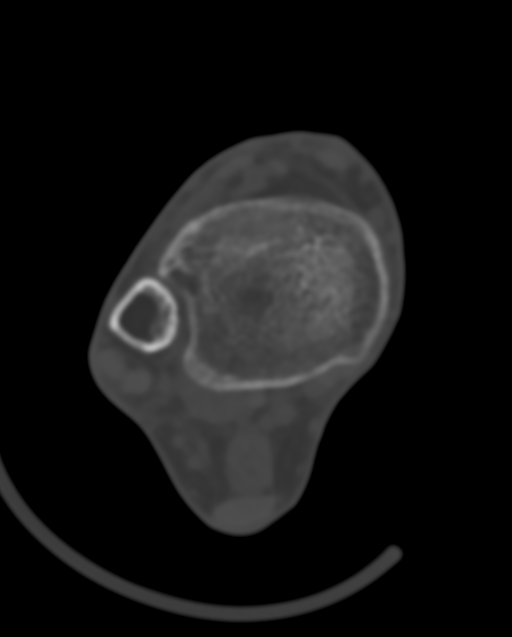
[im 70/83  bone]
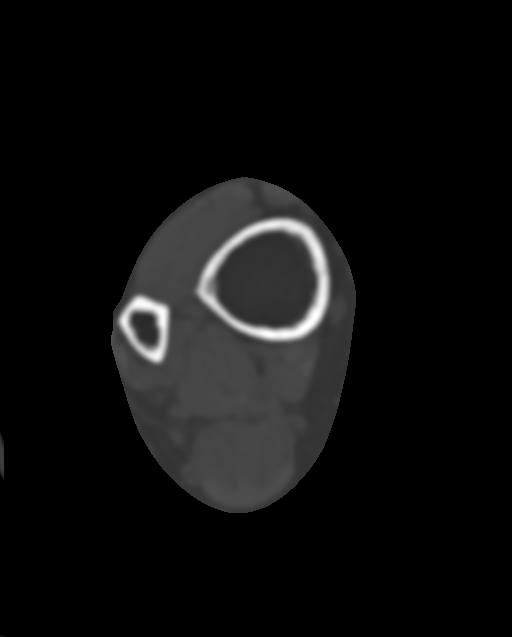

[Series 9: sfov lower extremity 2.00 br40 s3 soft · coronal · 0.19mm/px · 3 of 58 slices shown (2 of 3)]
[im 12/58  bone]
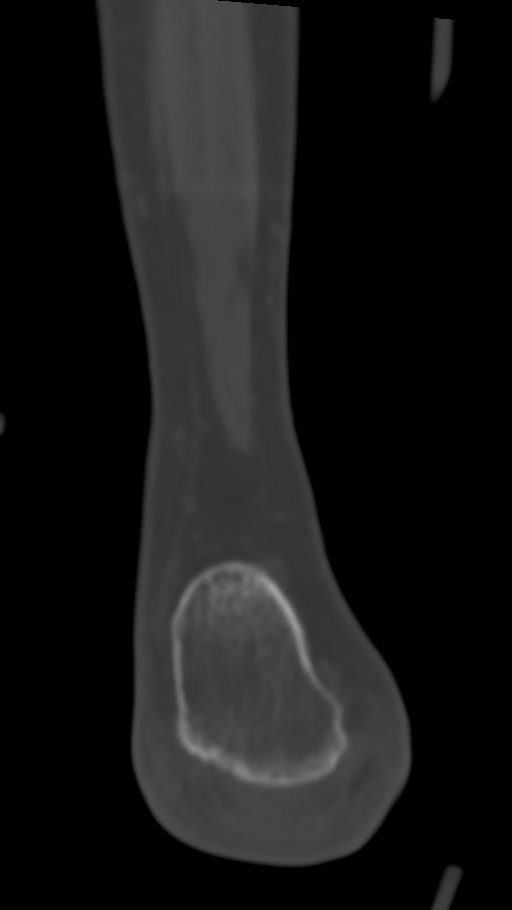
[im 23/58  bone]
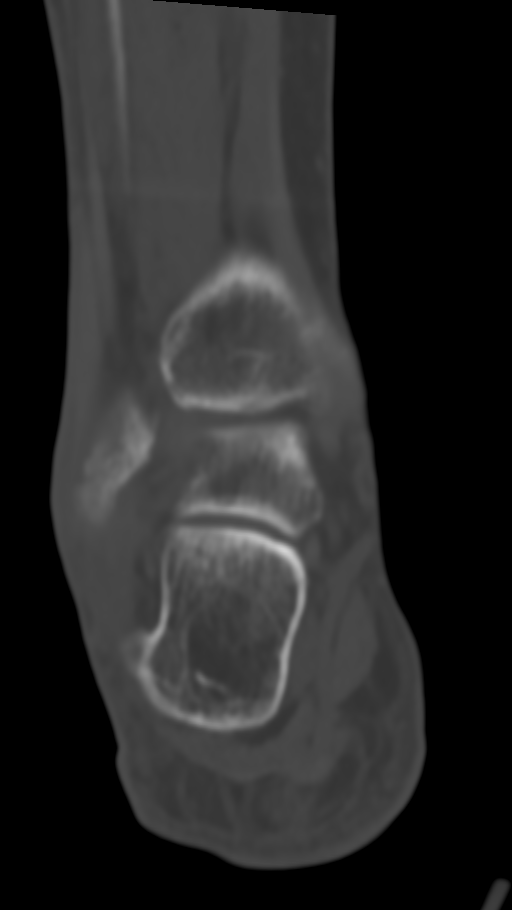
[im 35/58  bone]
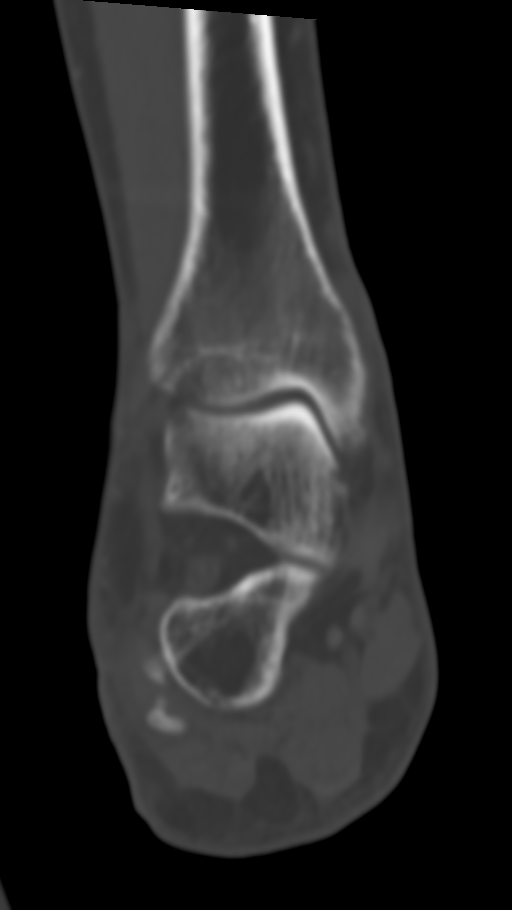

[Series 13: sfov lower extremity 2.00 br40 s3 soft · sagittal · 0.26mm/px · 5 of 49 slices shown, 6 images (3 of 3)]
[im 17/49  bone]
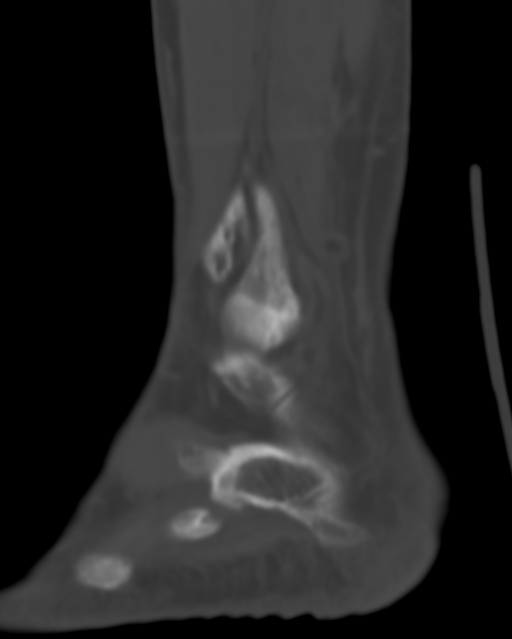
[im 21/49  bone]
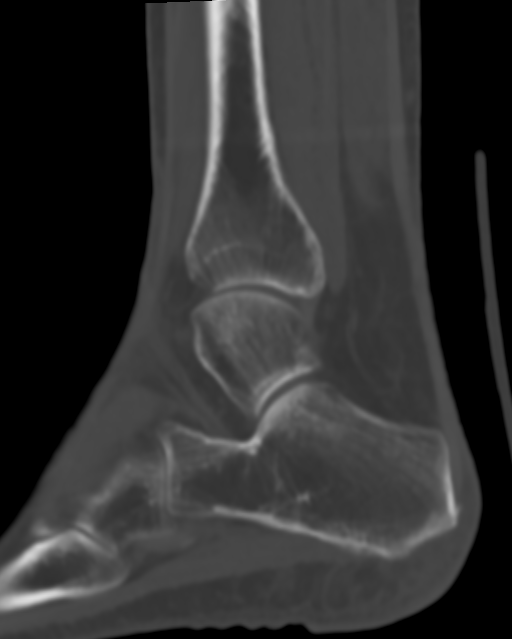
[im 25/49  soft-tissue]
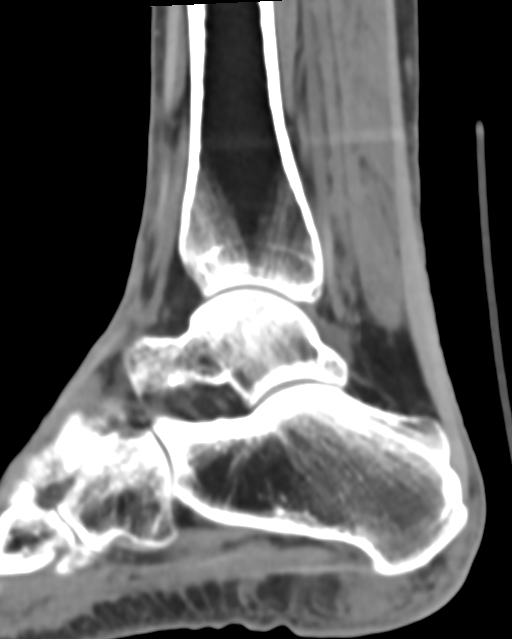
[im 25/49  bone]
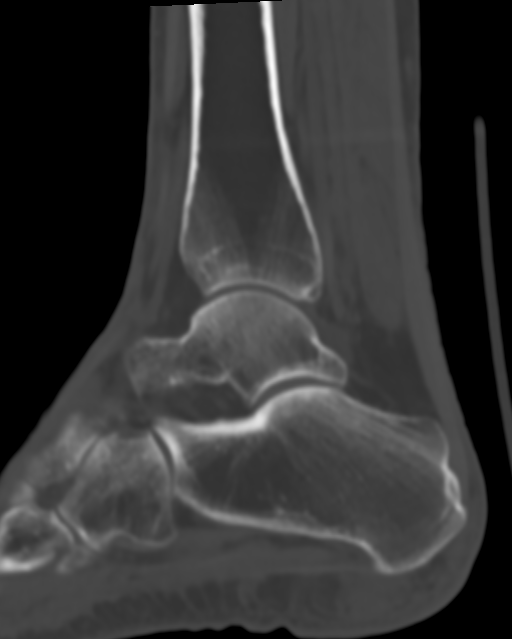
[im 29/49  bone]
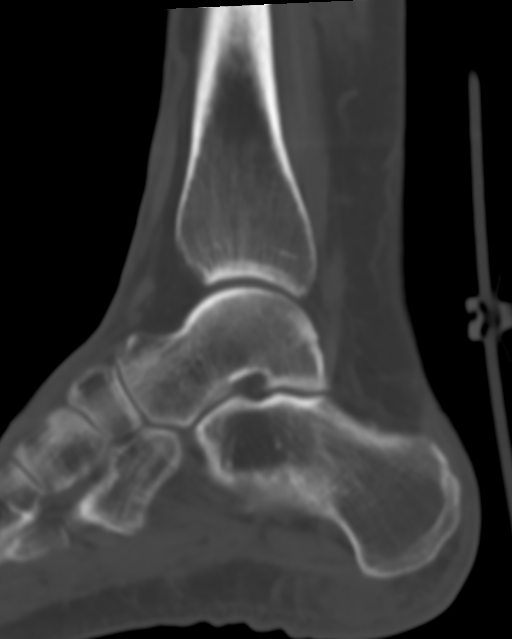
[im 33/49  bone]
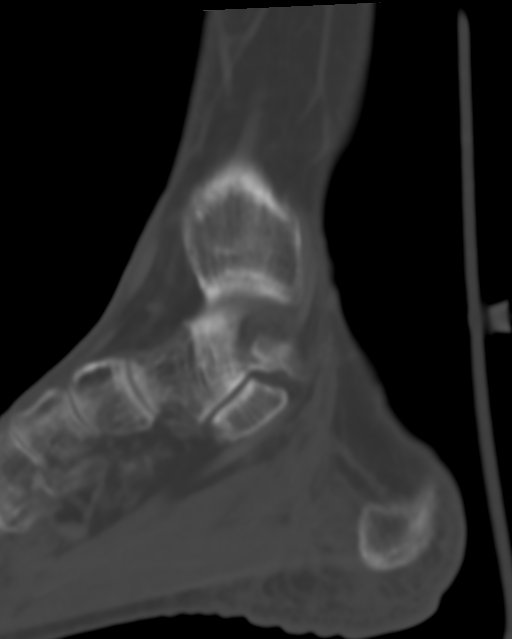

[12 of 35 positions shown; findings below may reference images not displayed]

FINDINGS: Bones/Joint/Cartilage

Generalized osteopenia. No fracture or dislocation. Normal
alignment. No joint effusion. Osseous prominence of the peroneal
tubercle.

Ligaments

Ligaments are suboptimally evaluated by CT.

Muscles and Tendons
Muscles are normal. No muscle atrophy. No intramuscular fluid
collection or hematoma. Tendinosis of the peroneus brevis with a
large longitudinal split tear. Flexor, extensor, peroneus longus and
Achilles tendons are intact.

Soft tissue
No fluid collection or hematoma.  No soft tissue mass.
IMPRESSION: 1. Tendinosis of the peroneus brevis with a large longitudinal split
tear. Osseous prominence of the peroneal tubercle.

## 2023-06-12 ENCOUNTER — Other Ambulatory Visit: Payer: Self-pay | Admitting: Internal Medicine

## 2023-06-15 NOTE — Telephone Encounter (Signed)
Pt pharmacy requesting medication not prescribed by Dr Ladona Ridgel. Please address thank you

## 2023-06-22 ENCOUNTER — Other Ambulatory Visit: Payer: Self-pay | Admitting: Internal Medicine

## 2023-08-12 ENCOUNTER — Telehealth: Payer: Self-pay | Admitting: Internal Medicine

## 2023-08-12 NOTE — Telephone Encounter (Signed)
   Pre-operative Risk Assessment    Patient Name: Kayla Jacobs  DOB: 09-25-1955 MRN: [id number]   Date of last office visit: 01/04/2023 Date of next office visit: none   Request for Surgical Clearance    Procedure:   right peroneal debridement with possible repair and/or tendon transfer  Date of Surgery:  Clearance TBD                                Surgeon:  Dr. Lillia Mountain Surgeon's Group or Practice Name:  [facility] Phone number:  [phone] Fax number:  [phone]   Type of Clearance Requested:   - Medical  - Pharmacy:  Hold Apixaban  (Eliquis ) (Eliquis  not mentioned on clearance form)   Type of Anesthesia:  General    Additional requests/questions:    Bonney Jonel LITTIE Smitty   08/12/2023, 4:48 PM

## 2023-08-15 ENCOUNTER — Telehealth: Payer: Self-pay | Admitting: Internal Medicine

## 2023-08-15 NOTE — Telephone Encounter (Signed)
 Patient with diagnosis of aflutter on Eliquis  for anticoagulation.    Procedure: right peroneal debridement with possible repair and/or tendon transfer  Date of procedure: TBD   CHA2DS2-VASc Score = 2   This indicates a 2.2% annual risk of stroke. The patient's score is based upon: CHF History: 0 HTN History: 0 Diabetes History: 0 Stroke History: 0 Vascular Disease History: 0 Age Score: 1 Gender Score: 1       CrCl 52 ml/min Platelet count 159  Per office protocol, patient can hold Eliquis  for 2 days prior to procedure.    **This guidance is not considered finalized until pre-operative APP has relayed final recommendations.**

## 2023-08-15 NOTE — Telephone Encounter (Signed)
 Pt c/o medication issue:  1. Name of Medication:   ELIQUIS  5 MG TABS tablet    2. How are you currently taking this medication (dosage and times per day)? N/A  3. Are you having a reaction (difficulty breathing--STAT)? No  4. What is your medication issue?   "Discuss coming off Eliquis  and using it as necessary."   " I haven't had any heart issues for a year and half and Dr. Carolynne Citron mentioned at my last apt. that going off my Eliquis  might be an option - going to as needed basis."  Answers received via pt schedule.

## 2023-08-16 ENCOUNTER — Telehealth: Payer: Self-pay

## 2023-08-16 NOTE — Telephone Encounter (Signed)
Patient has been scheduled for telephone visit and consent is done     Patient Consent for Virtual Visit         Denessa MADALYN LEGNER has provided verbal consent on 08/16/2023 for a virtual visit (video or telephone).   CONSENT FOR VIRTUAL VISIT FOR:  Di Kindle  By participating in this virtual visit I agree to the following:  I hereby voluntarily request, consent and authorize [facility] and its employed or contracted physicians, physician assistants, nurse practitioners or other licensed health care professionals (the Practitioner), to provide me with telemedicine health care services (the "Services") as deemed necessary by the treating Practitioner. I acknowledge and consent to receive the Services by the Practitioner via telemedicine. I understand that the telemedicine visit will involve communicating with the Practitioner through live audiovisual communication technology and the disclosure of certain medical information by electronic transmission. I acknowledge that I have been given the opportunity to request an in-person assessment or other available alternative prior to the telemedicine visit and am voluntarily participating in the telemedicine visit.  I understand that I have the right to withhold or withdraw my consent to the use of telemedicine in the course of my care at any time, without affecting my right to future care or treatment, and that the Practitioner or I may terminate the telemedicine visit at any time. I understand that I have the right to inspect all information obtained and/or recorded in the course of the telemedicine visit and may receive copies of available information for a reasonable fee.  I understand that some of the potential risks of receiving the Services via telemedicine include:  Delay or interruption in medical evaluation due to technological equipment failure or disruption; Information transmitted may not be sufficient (e.g. poor resolution of images)  to allow for appropriate medical decision making by the Practitioner; and/or  In rare instances, security protocols could fail, causing a breach of personal health information.  Furthermore, I acknowledge that it is my responsibility to provide information about my medical history, conditions and care that is complete and accurate to the best of my ability. I acknowledge that Practitioner's advice, recommendations, and/or decision may be based on factors not within their control, such as incomplete or inaccurate data provided by me or distortions of diagnostic images or specimens that may result from electronic transmissions. I understand that the practice of medicine is not an exact science and that Practitioner makes no warranties or guarantees regarding treatment outcomes. I acknowledge that a copy of this consent can be made available to me via my patient portal [facility] MyChart), or I can request a printed copy by calling the office of [facility].    I understand that my insurance will be billed for this visit.   I have read or had this consent read to me. I understand the contents of this consent, which adequately explains the benefits and risks of the Services being provided via telemedicine.  I have been provided ample opportunity to ask questions regarding this consent and the Services and have had my questions answered to my satisfaction. I give my informed consent for the services to be provided through the use of telemedicine in my medical care

## 2023-08-16 NOTE — Telephone Encounter (Signed)
Primary Cardiologist:None   Preoperative team, please contact this patient and set up a phone call appointment for further preoperative risk assessment. Please obtain consent and complete medication review. Thank you for your help.   Per office protocol, patient can hold Eliquis for 2 days prior to procedure   I also confirmed the patient resides in the state of [place]. As per [facility] Medical Board telemedicine laws, the patient must reside in the state in which the provider is licensed.   Levi Aland, NP-C  08/16/2023, 7:45 AM [address] Office [phone] Fax [phone]

## 2023-08-16 NOTE — Telephone Encounter (Signed)
Patient returned RN's call.

## 2023-08-16 NOTE — Telephone Encounter (Signed)
Spoke with Pt. Pt states she has not had any "episodes" since her ablation on 04/30/22. States her and Dr Ladona Ridgel spoke about coming off eliquis at 01/04/23 appointment (see notes) and would like to come off the medication. Told her Dr Ladona Ridgel would not be in the office until 2/24 but I would get back to her ASAP. Asked pt to continue eliquis until I was able to get back to her. Pt stated understanding.

## 2023-08-16 NOTE — Telephone Encounter (Signed)
LMTCB

## 2023-08-16 NOTE — Telephone Encounter (Signed)
Patient has been scheduled for telephone visit.

## 2023-08-23 NOTE — Telephone Encounter (Signed)
 Ok to come off the eliqius. Please let us know if she has more palpitations. GT

## 2023-08-26 NOTE — Telephone Encounter (Signed)
 Pt c/o medication issue:  1. Name of Medication:   ELIQUIS 5 MG TABS tablet    2. How are you currently taking this medication (dosage and times per day)? N/A  3. Are you having a reaction (difficulty breathing--STAT)? No  4. What is your medication issue?   "Just checking to see if Dr. Ladona Ridgel had a chance to consider my request to do the Eliquis as needed? :)"  "I have been taking 2 Flecainide/ day - one in the morning and one in the evening and have not had an episode since my last ablation - Oct./2023."  Answers received via pt schedule.

## 2023-08-26 NOTE — Telephone Encounter (Signed)
 Pt returning call, requesting cb

## 2023-08-26 NOTE — Telephone Encounter (Signed)
 Spoke with Pt. Gave Dr Lubertha Basque recommendations. Pt stated understanding and thanks.

## 2023-08-26 NOTE — Telephone Encounter (Signed)
 Left message for pt to call back

## 2023-08-29 NOTE — Telephone Encounter (Signed)
 Requesting office sent duplicate request. We have clearance request in process with the pt needing a tele preop appt, which has been scheduled for 09/19/23. Once the pt has been cleared our office will be sure to fax notes and recommendations to requesting office.

## 2023-09-18 NOTE — Progress Notes (Unsigned)
 Virtual Visit via Telephone Note   Because of MERCY LEPPLA co-morbid illnesses, she is at least at moderate risk for complications without adequate follow up.  This format is felt to be most appropriate for this patient at this time.  Due to technical limitations with video connection (technology), today's appointment will be conducted as an audio only telehealth visit, and RONDALYN BELFORD verbally agreed to proceed in this manner.   All issues noted in this document were discussed and addressed.  No physical exam could be performed with this format.  Evaluation Performed:  Preoperative cardiovascular risk assessment _____________   Date:  09/18/2023   Patient ID:  Kayla Jacobs, DOB [birth year], MRN [id number] Patient Location:  Home Provider location:   Office  Primary Care Provider:  Mila Palmer, MD Primary Cardiologist:  None  Chief Complaint / Patient Profile   68 y.o. y/o female with a h/o atrial flutter, paroxysmal atrial fibrillation who is pending right peroneal debridement with possible repair and tendon transfer and presents today for telephonic preoperative cardiovascular risk assessment.  History of Present Illness    Kayla Jacobs is a 68 y.o. female who presents via audio/video conferencing for a telehealth visit today.  Pt was last seen in cardiology clinic on 01/04/23 by Dr. Ladona Ridgel.  At that time MEERA VASCO was doing well .  The patient is now pending procedure as outlined above. Since her last visit, she continues to be stable from a cardiac standpoint.  Today she denies chest pain, shortness of breath, lower extremity edema, fatigue, palpitations, melena, hematuria, hemoptysis, diaphoresis, weakness, presyncope, syncope, orthopnea, and PND.   Past Medical History    Past Medical History:  Diagnosis Date   Cancer (HCC)    basal cell skin cancer   Osteopenia    Past Surgical History:  Procedure Laterality Date   A-FLUTTER ABLATION N/A 04/06/2019    Procedure: A-FLUTTER ABLATION;  Surgeon: Marinus Maw, MD;  Location: [facility];  Service: Cardiovascular;  Laterality: N/A;   A-FLUTTER ABLATION N/A 04/30/2022   Procedure: A-FLUTTER ABLATION;  Surgeon: Marinus Maw, MD;  Location: [facility];  Service: Cardiovascular;  Laterality: N/A;   CESAREAN SECTION  1988   COLONOSCOPY     LUMBAR DISC SURGERY  2005   POLYPECTOMY     TONSILLECTOMY  1962    Allergies  Allergies  Allergen Reactions   Macrobid [Nitrofurantoin] Swelling    Burning sensation and swelling    Home Medications    Prior to Admission medications   Medication Sig Start Date End Date Taking? Authorizing Provider  Calcium Carbonate-Vitamin D (CALCIUM-D PO) Take 1 tablet by mouth daily.    [provider]  diltiazem (CARDIZEM CD) 120 MG 24 hr capsule Take 120 mg by mouth daily as needed (a-fib). 04/11/22   [provider]  diltiazem (CARDIZEM) 30 MG tablet Take 1 tablet (30 mg total) by mouth 4 (four) times daily as needed (heart racing). 12/29/21   Marinus Maw, MD  ELIQUIS 5 MG TABS tablet TAKE 1 TABLET BY MOUTH TWICE A DAY 01/14/23   Sheilah Pigeon, PA-C  flecainide (TAMBOCOR) 50 MG tablet TAKE 1 TABLET BY MOUTH TWICE A DAY 06/15/23   Marinus Maw, MD  levothyroxine (SYNTHROID) 25 MCG tablet Take 12.5 mcg by mouth every morning. 11/07/20   [provider]  Multiple Vitamin (MULTIVITAMIN WITH MINERALS) TABS tablet Take 1 tablet by mouth daily.    [provider]    Physical Exam    Vital Signs:  Kayla Jacobs does not have vital signs available for review today.  Given telephonic nature of communication, physical exam is limited. AAOx3. NAD. Normal affect.  Speech and respirations are unlabored.  Accessory Clinical Findings    None  Assessment & Plan    1.  Preoperative Cardiovascular Risk Assessment: Procedure:   right peroneal debridement with possible repair and/or tendon transfer   Date of  Surgery:  Clearance TBD                                  Surgeon:  Dr. Netta Cedars Surgeon's Group or Practice Name:  [facility] Phone number:  [phone] Fax number:  [phone]      Primary Cardiologist: Dr. Ladona Ridgel  Chart reviewed as part of pre-operative protocol coverage. Given past medical history and time since last visit, based on ACC/AHA guidelines, Kayla Jacobs would be at acceptable risk for the planned procedure without further cardiovascular testing.   Her RCRI is very low risk, 0.4% risk of major cardiac event.  She is able to complete greater than 4 METS of physical activity.  Patient was advised that if she develops new symptoms prior to surgery to contact our office to arrange a follow-up appointment.  He verbalized understanding.  Patient is no longer taking Eliquis.  I will route this recommendation to the requesting party via Epic fax function and remove from pre-op pool.       Time:   Today, I have spent 5 minutes with the patient with telehealth technology discussing medical history, symptoms, and management plan.  I spent 10 minutes reviewing past medical history, cardiac medications, and cardiac testing.   Ronney Asters, NP  09/18/2023, 7:49 PM

## 2023-09-19 ENCOUNTER — Encounter: Payer: Self-pay | Admitting: Internal Medicine

## 2023-09-19 ENCOUNTER — Ambulatory Visit: Payer: 59 | Attending: Cardiology

## 2023-09-19 DIAGNOSIS — Z01810 Encounter for preprocedural cardiovascular examination: Secondary | ICD-10-CM

## 2023-10-19 ENCOUNTER — Encounter (HOSPITAL_BASED_OUTPATIENT_CLINIC_OR_DEPARTMENT_OTHER): Payer: Self-pay | Admitting: Orthopaedic Surgery

## 2023-10-19 ENCOUNTER — Other Ambulatory Visit: Payer: Self-pay

## 2023-10-19 NOTE — Progress Notes (Signed)
   10/19/23 0950  PAT Phone Screen  Is the patient taking a GLP-1 receptor agonist? No  Do You Have Diabetes? No  Do You Have Hypertension? No  Have You Ever Been to the ER for Asthma? No  Have You Taken Oral Steroids in the Past 3 Months? No  Do you Take Phenteramine or any Other Diet Drugs? No  Recent  Lab Work, EKG, CXR? Yes  Where was this test performed? EKG 01/2023  Do you have a history of heart problems? (S)  Yes  Cardiologist Name (S)  Dr Carolynne Citron for hx of ablation x 2 a fib/ a flutter - Cards Clearance 09/19/23  Have you ever had tests on your heart? Yes  What cardiac tests were performed? (S)  Echo  What date/year were cardiac tests completed? (S)  last echo 2020 EF 60-65%  Results viewable: [facility] Media Tab  Any Recent Hospitalizations? No  Height 5' 5.5" (1.664 m)  Weight 54.4 kg  Pat Appointment Scheduled No  Reason for No Appointment Not Needed

## 2023-10-22 NOTE — H&P (Signed)
 ORTHOPAEDIC SURGERY H&P  Subjective:  The patient presents with right peroneal tendinopathy.   Past Medical History:  Diagnosis Date   Cancer (HCC)    basal cell skin cancer   Dysrhythmia    Osteopenia     Past Surgical History:  Procedure Laterality Date   A-FLUTTER ABLATION N/A 04/06/2019   Procedure: A-FLUTTER ABLATION;  Surgeon: Tammie Fall, MD;  Location: [facility];  Service: Cardiovascular;  Laterality: N/A;   A-FLUTTER ABLATION N/A 04/30/2022   Procedure: A-FLUTTER ABLATION;  Surgeon: Tammie Fall, MD;  Location: [facility];  Service: Cardiovascular;  Laterality: N/A;   CESAREAN SECTION  1988   COLONOSCOPY     LUMBAR DISC SURGERY  2005   POLYPECTOMY     TONSILLECTOMY  1962     (Not in an outpatient encounter)    Allergies  Allergen Reactions   Macrobid [Nitrofurantoin] Swelling    Burning sensation and swelling    Social History   Socioeconomic History   Marital status: Married    Spouse name: Not on file   Number of children: Not on file   Years of education: Not on file   Highest education level: Not on file  Occupational History   Not on file  Tobacco Use   Smoking status: Never   Smokeless tobacco: Never  Vaping Use   Vaping status: Never Used  Substance and Sexual Activity   Alcohol use: Yes    Alcohol/week: 7.0 standard drinks of alcohol    Types: 7 Glasses of wine per week    Comment: rare   Drug use: No   Sexual activity: Not on file  Other Topics Concern   Not on file  Social History Narrative   Not on file   Social Drivers of Health   Financial Resource Strain: Not on file  Food Insecurity: Not on file  Transportation Needs: Not on file  Physical Activity: Not on file  Stress: Not on file  Social Connections: Not on file  Intimate Partner Violence: Not on file     History reviewed. No pertinent family history.   Review of Systems Pertinent items are noted in HPI.  Objective: Vital signs in last  24 hours:    10/19/2023    9:50 AM 01/04/2023   11:12 AM 06/03/2022    4:44 PM  Vitals with BMI  Height 5' 5.5" 5' 5.5" 5' 5.5"  Weight 120 lbs 110 lbs 110 lbs  BMI 19.66 18.02 18.02  Systolic  116 110  Diastolic  70 66  Pulse  62 59      EXAM: General: Well nourished, well developed. Awake, alert and oriented to time, place, person. Normal mood and affect. No apparent distress. Breathing room air.  Operative Lower Extremity: Alignment - Neutral Deformity - None Skin intact Tenderness to palpation - right peroneal tendons 5/5 TA, PT, GS, Per, EHL, FHL Sensation intact to light touch throughout Palpable DP and PT pulses Special testing: None  The contralateral foot/ankle was examined for comparison and noted to be neurovascularly intact with no localized deformity, swelling, or tenderness.  Imaging Review All images taken were independently reviewed by me.  Assessment/Plan: The clinical and radiographic findings were reviewed and discussed at length with the patient.  The patient has right peroneal tendinopathy.  We spoke at length about the natural course of these findings. We discussed nonoperative and operative treatment options in detail.  The risks and benefits were presented and reviewed. The risks  due to tendon transfer, tendon rerupture, hardware failure/irritation, new/persistent/recurrent infection, stiffness, nerve/vessel/tendon injury, nonunion/malunion of any fracture, wound healing issues, allograft usage, development of arthritis, failure of this surgery, possibility of external fixation in certain situations, possibility of delayed definitive surgery, need for further surgery, prolonged wound care including further soft tissue coverage procedures, thromboembolic events, anesthesia/medical complications/events perioperatively and beyond, amputation, death among others were discussed. The patient acknowledged the explanation and agreed to proceed with the  plan.  Kayla Jacobs  Orthopaedic Surgery [facility]

## 2023-10-22 NOTE — Discharge Instructions (Addendum)
 Ali Ink, MD [facility]  Please read the following information regarding your care after surgery.  Medications  You only need a prescription for the narcotic pain medicine (ex. oxycodone, Percocet, Norco).  All of the other medicines listed below are available over the counter. ? Aleve 2 pills twice a day for the first 3 days after surgery. ? acetominophen (Tylenol ) 650 mg every 4-6 hours as you need for minor to moderate pain ? oxycodone as prescribed for severe pain  ? To help prevent blood clots, take aspirin  (81 mg) twice daily for 28 days after surgery.  You should also get up every hour while you are awake to move around.  Weight Bearing ? OK to walk on the operative leg only AFTER the nerve block has completely worn off.  Cast / Splint / Dressing ? Keep your dressing clean and dry.  Don't put anything (coat hanger, pencil, etc) down inside of it.  If it gets wet, please notify the office immediately.  Swelling IMPORTANT: It is normal for you to have swelling where you had surgery. To reduce swelling and pain, keep at least 3 pillows under your leg so that your toes are above your nose and your heel is above the level of your hip.  It may be necessary to keep your foot or leg elevated for several weeks.  This is critical to helping your incisions heal and your pain to feel better.  Follow Up Call my office at [phone] when you are discharged from the hospital or surgery center to schedule an appointment to be seen within 7-10 days after surgery.  Call my office at [phone] if you develop a fever >101.5 F, nausea, vomiting, bleeding from the surgical site or severe pain.      Post Anesthesia Home Care Instructions  Activity: Get plenty of rest for the remainder of the day. A responsible individual must stay with you for 24 hours following the procedure.  For the next 24 hours, DO NOT: -Drive a car -Advertising copywriter -Drink alcoholic beverages -Take any  medication unless instructed by your physician -Make any legal decisions or sign important papers.  Meals: Start with liquid foods such as gelatin or soup. Progress to regular foods as tolerated. Avoid greasy, spicy, heavy foods. If nausea and/or vomiting occur, drink only clear liquids until the nausea and/or vomiting subsides. Call your physician if vomiting continues.  Special Instructions/Symptoms: Your throat may feel dry or sore from the anesthesia or the breathing tube placed in your throat during surgery. If this causes discomfort, gargle with warm salt water. The discomfort should disappear within 24 hours.   Regional Anesthesia Blocks  1. You may not be able to move or feel the "blocked" extremity after a regional anesthetic block. This may last may last from 3-48 hours after placement, but it will go away. The length of time depends on the medication injected and your individual response to the medication. As the nerves start to wake up, you may experience tingling as the movement and feeling returns to your extremity. If the numbness and inability to move your extremity has not gone away after 48 hours, please call your surgeon.   2. The extremity that is blocked will need to be protected until the numbness is gone and the strength has returned. Because you cannot feel it, you will need to take extra care to avoid injury. Because it may be weak, you may have difficulty moving it or using it. You may not know  what position it is in without looking at it while the block is in effect.  3. For blocks in the legs and feet, returning to weight bearing and walking needs to be done carefully. You will need to wait until the numbness is entirely gone and the strength has returned. You should be able to move your leg and foot normally before you try and bear weight or walk. You will need someone to be with you when you first try to ensure you do not fall and possibly risk injury.  4. Bruising and  tenderness at the needle site are common side effects and will resolve in a few days.  5. Persistent numbness or new problems with movement should be communicated to the surgeon or the [facility] [phone] [facility] [phone]).

## 2023-10-26 ENCOUNTER — Ambulatory Visit (HOSPITAL_BASED_OUTPATIENT_CLINIC_OR_DEPARTMENT_OTHER)

## 2023-10-26 ENCOUNTER — Ambulatory Visit (HOSPITAL_BASED_OUTPATIENT_CLINIC_OR_DEPARTMENT_OTHER): Admitting: Anesthesiology

## 2023-10-26 ENCOUNTER — Encounter (HOSPITAL_BASED_OUTPATIENT_CLINIC_OR_DEPARTMENT_OTHER)
Admission: RE | Disposition: A | Discharge status: Home / Self Care | Payer: Self-pay | Source: Home / Self Care | Attending: Orthopaedic Surgery

## 2023-10-26 ENCOUNTER — Encounter (HOSPITAL_BASED_OUTPATIENT_CLINIC_OR_DEPARTMENT_OTHER): Payer: Self-pay | Admitting: Orthopaedic Surgery

## 2023-10-26 ENCOUNTER — Ambulatory Visit (HOSPITAL_BASED_OUTPATIENT_CLINIC_OR_DEPARTMENT_OTHER)
Admission: RE | Admit: 2023-10-26 | Discharge: 2023-10-26 | Disposition: A | Discharge status: Home / Self Care | Attending: Orthopaedic Surgery | Admitting: Orthopaedic Surgery

## 2023-10-26 ENCOUNTER — Other Ambulatory Visit: Payer: Self-pay

## 2023-10-26 DIAGNOSIS — M7671 Peroneal tendinitis, right leg: Secondary | ICD-10-CM

## 2023-10-26 HISTORY — PX: REPAIR OF PERONEUS BREVIS TENDON: SHX6215

## 2023-10-26 HISTORY — DX: Cardiac arrhythmia, unspecified: I49.9

## 2023-10-26 SURGERY — REPAIR, TENDON, PERONEUS BREVIS
Anesthesia: Regional | Site: Ankle | Laterality: Right

## 2023-10-26 MED ORDER — ACETAMINOPHEN 10 MG/ML IV SOLN: 1000.0000 mg | Freq: Once | INTRAVENOUS | Status: DC | PRN

## 2023-10-26 MED ORDER — VANCOMYCIN HCL 500 MG IV SOLR
INTRAVENOUS | Status: DC | PRN
  Administered 2023-10-26: 500 mg via TOPICAL

## 2023-10-26 MED ORDER — DEXAMETHASONE SODIUM PHOSPHATE 10 MG/ML IJ SOLN
INTRAMUSCULAR | Status: DC | PRN
  Administered 2023-10-26: 10 mg

## 2023-10-26 MED ORDER — FENTANYL CITRATE (PF) 100 MCG/2ML IJ SOLN
100.0000 ug | Freq: Once | INTRAMUSCULAR | Status: AC
  Administered 2023-10-26: 50 ug via INTRAVENOUS

## 2023-10-26 MED ORDER — VANCOMYCIN HCL 500 MG IV SOLR
INTRAVENOUS | Status: AC
  Filled 2023-10-26: qty 10

## 2023-10-26 MED ORDER — LIDOCAINE 2% (20 MG/ML) 5 ML SYRINGE
INTRAMUSCULAR | Status: DC | PRN
  Administered 2023-10-26: 60 mg via INTRAVENOUS

## 2023-10-26 MED ORDER — FENTANYL CITRATE (PF) 100 MCG/2ML IJ SOLN: 25.0000 ug | INTRAMUSCULAR | Status: DC | PRN

## 2023-10-26 MED ORDER — CHLORHEXIDINE GLUCONATE 4 % EX SOLN: 60.0000 mL | Freq: Once | CUTANEOUS | Status: DC

## 2023-10-26 MED ORDER — GLYCOPYRROLATE PF 0.2 MG/ML IJ SOSY
PREFILLED_SYRINGE | INTRAMUSCULAR | Status: AC
  Filled 2023-10-26: qty 1

## 2023-10-26 MED ORDER — KETOROLAC TROMETHAMINE 30 MG/ML IJ SOLN
INTRAMUSCULAR | Status: AC
  Filled 2023-10-26: qty 1

## 2023-10-26 MED ORDER — CEFAZOLIN SODIUM-DEXTROSE 2-4 GM/100ML-% IV SOLN
2.0000 g | INTRAVENOUS | Status: AC
  Administered 2023-10-26: 2 g via INTRAVENOUS

## 2023-10-26 MED ORDER — EPHEDRINE 5 MG/ML INJ
INTRAVENOUS | Status: AC
  Filled 2023-10-26: qty 5

## 2023-10-26 MED ORDER — DEXAMETHASONE SODIUM PHOSPHATE 10 MG/ML IJ SOLN
INTRAMUSCULAR | Status: AC
  Filled 2023-10-26: qty 1

## 2023-10-26 MED ORDER — POVIDONE-IODINE 10 % EX SOLN
CUTANEOUS | Status: DC | PRN
  Administered 2023-10-26: 1 via TOPICAL

## 2023-10-26 MED ORDER — ROPIVACAINE HCL 5 MG/ML IJ SOLN
INTRAMUSCULAR | Status: DC | PRN
  Administered 2023-10-26: 25 mL via PERINEURAL

## 2023-10-26 MED ORDER — LACTATED RINGERS IV SOLN: INTRAVENOUS | Status: DC

## 2023-10-26 MED ORDER — ONDANSETRON HCL 4 MG/2ML IJ SOLN
INTRAMUSCULAR | Status: AC
  Filled 2023-10-26: qty 2

## 2023-10-26 MED ORDER — PROPOFOL 10 MG/ML IV BOLUS
INTRAVENOUS | Status: DC | PRN
  Administered 2023-10-26: 120 mg via INTRAVENOUS

## 2023-10-26 MED ORDER — FENTANYL CITRATE (PF) 100 MCG/2ML IJ SOLN
INTRAMUSCULAR | Status: DC | PRN
  Administered 2023-10-26: 50 ug via INTRAVENOUS

## 2023-10-26 MED ORDER — EPHEDRINE SULFATE-NACL 50-0.9 MG/10ML-% IV SOSY
PREFILLED_SYRINGE | INTRAVENOUS | Status: DC | PRN
  Administered 2023-10-26 (×2): 10 mg via INTRAVENOUS

## 2023-10-26 MED ORDER — MIDAZOLAM HCL 2 MG/2ML IJ SOLN
INTRAMUSCULAR | Status: AC
  Filled 2023-10-26: qty 2

## 2023-10-26 MED ORDER — DROPERIDOL 2.5 MG/ML IJ SOLN: 0.6250 mg | Freq: Once | INTRAMUSCULAR | Status: DC | PRN

## 2023-10-26 MED ORDER — BUPIVACAINE-EPINEPHRINE (PF) 0.5% -1:200000 IJ SOLN
INTRAMUSCULAR | Status: AC
  Filled 2023-10-26: qty 30

## 2023-10-26 MED ORDER — FENTANYL CITRATE (PF) 100 MCG/2ML IJ SOLN
INTRAMUSCULAR | Status: AC
  Filled 2023-10-26: qty 2

## 2023-10-26 MED ORDER — MIDAZOLAM HCL 2 MG/2ML IJ SOLN
2.0000 mg | Freq: Once | INTRAMUSCULAR | Status: AC
  Administered 2023-10-26: 1 mg via INTRAVENOUS

## 2023-10-26 MED ORDER — 0.9 % SODIUM CHLORIDE (POUR BTL) OPTIME
TOPICAL | Status: DC | PRN
  Administered 2023-10-26: 200 mL

## 2023-10-26 MED ORDER — PROPOFOL 500 MG/50ML IV EMUL
INTRAVENOUS | Status: AC
  Filled 2023-10-26: qty 50

## 2023-10-26 MED ORDER — ONDANSETRON HCL 4 MG/2ML IJ SOLN
INTRAMUSCULAR | Status: DC | PRN
  Administered 2023-10-26: 4 mg via INTRAVENOUS

## 2023-10-26 MED ORDER — CEFAZOLIN SODIUM-DEXTROSE 2-4 GM/100ML-% IV SOLN
INTRAVENOUS | Status: AC
  Filled 2023-10-26: qty 100

## 2023-10-26 MED ORDER — GLYCOPYRROLATE PF 0.2 MG/ML IJ SOSY
PREFILLED_SYRINGE | INTRAMUSCULAR | Status: DC | PRN
Start: 2023-10-26 — End: 2023-10-26
  Administered 2023-10-26: .2 mg via INTRAVENOUS

## 2023-10-26 MED ORDER — LIDOCAINE 2% (20 MG/ML) 5 ML SYRINGE
INTRAMUSCULAR | Status: AC
  Filled 2023-10-26: qty 5

## 2023-10-26 SURGICAL SUPPLY — 59 items
BANDAGE ESMARK 6X9 LF (GAUZE/BANDAGES/DRESSINGS) IMPLANT
BLADE AVERAGE 25X9 (BLADE) IMPLANT
BLADE SURG 15 STRL LF DISP TIS (BLADE) ×2 IMPLANT
BNDG COHESIVE 4X5 TAN STRL LF (GAUZE/BANDAGES/DRESSINGS) ×1 IMPLANT
BNDG ELASTIC 6X10 VLCR STRL LF (GAUZE/BANDAGES/DRESSINGS) ×1 IMPLANT
BNDG GAUZE DERMACEA FLUFF 4 (GAUZE/BANDAGES/DRESSINGS) ×1 IMPLANT
BOOT STEPPER DURA MED (SOFTGOODS) IMPLANT
CHLORAPREP W/TINT 26 (MISCELLANEOUS) ×1 IMPLANT
COVER BACK TABLE 60X90IN (DRAPES) ×1 IMPLANT
CUFF TRNQT CYL 34X4.125X (TOURNIQUET CUFF) IMPLANT
DRAPE EXTREMITY T 121X128X90 (DISPOSABLE) ×1 IMPLANT
DRAPE IMP U-DRAPE 54X76 (DRAPES) ×1 IMPLANT
DRAPE OEC MINIVIEW 54X84 (DRAPES) IMPLANT
DRAPE U-SHAPE 47X51 STRL (DRAPES) ×1 IMPLANT
DRSG MEPITEL 4X7.2 (GAUZE/BANDAGES/DRESSINGS) ×1 IMPLANT
ELECTRODE REM PT RTRN 9FT ADLT (ELECTROSURGICAL) ×1 IMPLANT
GAUZE PAD ABD 8X10 STRL (GAUZE/BANDAGES/DRESSINGS) ×3 IMPLANT
GAUZE SPONGE 4X4 12PLY STRL (GAUZE/BANDAGES/DRESSINGS) ×1 IMPLANT
GLOVE BIOGEL PI IND STRL 8 (GLOVE) ×2 IMPLANT
GLOVE SURG SS PI 7.5 STRL IVOR (GLOVE) ×2 IMPLANT
GOWN STRL REUS W/ TWL LRG LVL3 (GOWN DISPOSABLE) ×2 IMPLANT
KIT FIBERTAK DX 1.6 DISP (KITS) IMPLANT
NDL HYPO 22X1.5 SAFETY MO (MISCELLANEOUS) IMPLANT
NDL HYPO 25X1 1.5 SAFETY (NEEDLE) IMPLANT
NDL SAFETY ECLIPSE 18X1.5 (NEEDLE) ×1 IMPLANT
NDL SUT 6 .5 CRC .975X.05 MAYO (NEEDLE) IMPLANT
NEEDLE HYPO 22X1.5 SAFETY MO (MISCELLANEOUS) IMPLANT
NEEDLE HYPO 25X1 1.5 SAFETY (NEEDLE) IMPLANT
NS IRRIG 1000ML POUR BTL (IV SOLUTION) ×1 IMPLANT
PACK BASIN DAY SURGERY FS (CUSTOM PROCEDURE TRAY) ×1 IMPLANT
PAD CAST 4YDX4 CTTN HI CHSV (CAST SUPPLIES) ×1 IMPLANT
PADDING CAST SYNTHETIC 4X4 STR (CAST SUPPLIES) ×2 IMPLANT
PADDING CAST SYNTHETIC 6X4 NS (CAST SUPPLIES) ×3 IMPLANT
PASSER SUT SWANSON 36MM LOOP (INSTRUMENTS) IMPLANT
PENCIL SMOKE EVACUATOR (MISCELLANEOUS) ×1 IMPLANT
RETRIEVER SUT HEWSON (MISCELLANEOUS) IMPLANT
SANITIZER HAND PURELL FF 515ML (MISCELLANEOUS) ×1 IMPLANT
SET IRRIG Y TYPE TUR BLADDER L (SET/KITS/TRAYS/PACK) IMPLANT
SHAVER SABRE 2.0 (BURR) IMPLANT
SHEET MEDIUM DRAPE 40X70 STRL (DRAPES) ×1 IMPLANT
SLEEVE SCD COMPRESS KNEE MED (STOCKING) ×1 IMPLANT
SPIKE FLUID TRANSFER (MISCELLANEOUS) IMPLANT
SPLINT FIBERGLASS 4X30 (CAST SUPPLIES) ×1 IMPLANT
SPONGE T-LAP 18X18 ~~LOC~~+RFID (SPONGE) ×1 IMPLANT
STAPLER SKIN PROX WIDE 3.9 (STAPLE) IMPLANT
STOCKINETTE 6 STRL (DRAPES) ×1 IMPLANT
SUCTION TUBE FRAZIER 10FR DISP (SUCTIONS) IMPLANT
SUT ETHIBOND 2 OS 4 DA (SUTURE) IMPLANT
SUT ETHILON 2 0 FS 18 (SUTURE) ×1 IMPLANT
SUT MNCRL AB 3-0 PS2 18 (SUTURE) IMPLANT
SUT VIC AB 0 CT1 27XBRD ANBCTR (SUTURE) IMPLANT
SUT VIC AB 2-0 CT1 TAPERPNT 27 (SUTURE) IMPLANT
SUT VICRYL 0 SH 27 (SUTURE) IMPLANT
SUTURE FIBERWR 2-0 18 17.9 3/8 (SUTURE) IMPLANT
SYR 20ML LL LF (SYRINGE) ×1 IMPLANT
SYR BULB EAR ULCER 3OZ GRN STR (SYRINGE) ×1 IMPLANT
TOWEL GREEN STERILE FF (TOWEL DISPOSABLE) ×2 IMPLANT
TUBE CONNECTING 20X1/4 (TUBING) IMPLANT
UNDERPAD 30X36 HEAVY ABSORB (UNDERPADS AND DIAPERS) ×1 IMPLANT

## 2023-10-26 NOTE — Op Note (Signed)
 10/26/2023  8:23 PM   PATIENT: Kayla Jacobs  68 y.o. female  MRN: [id number]   PRE-OPERATIVE DIAGNOSIS:   Peroneal tendinopathy of right lower limb   POST-OPERATIVE DIAGNOSIS:   Same   PROCEDURE: 1] Right peroneus brevis tendon debridement and repair 2] Right peroneus longus tendon transfer/tenodesis 3] Excision of right peroneal tubercle   SURGEON:  Ali Ink, MD   ASSISTANT: None   ANESTHESIA: General, regional   EBL: Minimal   TOURNIQUET:    Total Tourniquet Time Documented: Thigh (Right) - 49 minutes Total: Thigh (Right) - 49 minutes    COMPLICATIONS: None apparent   DISPOSITION: Extubated, awake and stable to recovery.   INDICATION FOR PROCEDURE: The patient presented with above diagnosis.  We discussed the diagnosis, alternative treatment options, risks and benefits of the above surgical intervention, as well as alternative non-operative treatments. All questions/concerns were addressed and the patient/family demonstrated appropriate understanding of the diagnosis, the procedure, the postoperative course, and overall prognosis. The patient wished to proceed with surgical intervention and signed an informed surgical consent as such, in each others presence prior to surgery.   PROCEDURE IN DETAIL: After preoperative consent was obtained and the correct operative site was identified, the patient was brought to the operating room supine on stretcher and transferred onto operating table. General anesthesia was induced. Preoperative antibiotics were administered. Surgical timeout was taken. The patient was then positioned supine with an ipsilateral hip bump. The operative lower extremity was prepped and draped in standard sterile fashion with a tourniquet around the thigh. The extremity was exsanguinated and the tourniquet was inflated to 275 mmHg.  Routine and intraoperative fluoroscopic evaluation of the ankle joint demonstrated no lateral laxity  with drawer testing. Full dorsiflexion as well as plantarflexion was possible.   A standard curvilinear approach was made over the peroneal tendons.    We began by windowing the approach posteriorly to access each of the peroneal tendons. The sheath was incised and tenosynovial fluid was readily evacuated. We then sequentially evaluated both the peroneus longus and brevis tendons with extensive tearing noted in both. We did note extensive tenosynovitis that was debrided thoroughly. The peroneus brevis was repaired using vicryl. The peroneus longus was significantly degenerated so this was transferred to the brevis via tenodesis. There was no instability of peroneal tendons to intraoperative stress testing. The peroneal tendon sheath was closed with vicryl.    The hypertrophic peroneal tubercle was excised by rongeur as verified by direct inspection and intraoperative fluoroscopy.   The surgical sites were thoroughly irrigated. The tourniquet was deflated and hemostasis achieved. Betadine  and vancomycin  powder were applied. The deep layers were closed using 2-0 vicryl. The skin was closed without tension using 2-0 nylon suture.    The leg was cleaned with saline and sterile dressings with gauze were applied. A well padded bulky wrap with CAM boot was applied. The patient was awakened from anesthesia and transported to the recovery room in stable condition.    FOLLOW UP PLAN: -transfer to PACU, then home -protected WB in boot operative extremity, maximum elevation -maintain dry dressings until follow up -DVT ppx: Aspirin  81 mg twice daily -follow up as outpatient within 7-10 days for wound check -sutures out in 2-3 weeks in outpatient office   RADIOGRAPHS: 1] Right Foot Series AP, lateral, oblique radiographs of the right foot were obtained intraoperatively. These showed interval excision of peroneal tubercle. No other acute injuries are noted.  2] Right Ankle Series AP, lateral, oblique  radiographs  of the right ankle were obtained intraoperatively. These showed no laxity at the ankle. No other acute injuries are noted.   Ali Ink Orthopaedic Surgery [facility]

## 2023-10-26 NOTE — H&P (Signed)
 H&P Update:  -History and Physical Reviewed  -Patient has been re-examined  -No change in the plan of care  -The risks and benefits were presented and reviewed. The risks due to tendon rerupture, tendon transfer, hardware/suture failure and/or irritation, new/persistent infection, stiffness, nerve/vessel/tendon injury or rerupture of repaired tendon, nonunion/malunion, allograft usage, wound healing issues, development of arthritis, failure of this surgery, possibility of external fixation with delayed definitive surgery, need for further surgery, thromboembolic events, anesthesia/medical complications, amputation, death among others were discussed. The patient acknowledged the explanation, agreed to proceed with the plan and a consent was signed.  Kayla Jacobs

## 2023-10-26 NOTE — Anesthesia Procedure Notes (Signed)
 Procedure Name: LMA Insertion Date/Time: 10/26/2023 7:48 AM  Performed by: Darcel Early, CRNAPre-anesthesia Checklist: Patient identified, Emergency Drugs available, Suction available, Patient being monitored and Timeout performed Patient Re-evaluated:Patient Re-evaluated prior to induction Oxygen Delivery Method: Circle system utilized Preoxygenation: Pre-oxygenation with 100% oxygen Induction Type: IV induction Ventilation: Mask ventilation without difficulty LMA: LMA inserted LMA Size: 4.0 Number of attempts: 2 Placement Confirmation: positive ETCO2 Tube secured with: Tape Dental Injury: Teeth and Oropharynx as per pre-operative assessment

## 2023-10-26 NOTE — Anesthesia Preprocedure Evaluation (Addendum)
 Anesthesia Evaluation  Patient identified by MRN, date of birth, ID band Patient awake    Airway Mallampati: II  TM Distance: >3 FB Neck ROM: Full    Dental no notable dental hx.    Pulmonary neg pulmonary ROS   Pulmonary exam normal        Cardiovascular negative cardio ROS + dysrhythmias (s/p ablation, off Eliquis ) Atrial Fibrillation  Rhythm:Regular Rate:Normal     Neuro/Psych negative neurological ROS  negative psych ROS   GI/Hepatic negative GI ROS, Neg liver ROS,,,  Endo/Other  negative endocrine ROS    Renal/GU negative Renal ROS  negative genitourinary   Musculoskeletal Peroneal tendinitis    Abdominal Normal abdominal exam  (+)   Peds  Hematology Lab Results      Component                Value               Date                      WBC                      4.7                 04/14/2022                HGB                      12.9                04/14/2022                HCT                      37.8                04/14/2022                MCV                      98 (H)              04/14/2022                PLT                      171                 04/14/2022              Anesthesia Other Findings   Reproductive/Obstetrics                             Anesthesia Physical Anesthesia Plan  ASA: 2  Anesthesia Plan: General and Regional   Post-op Pain Management: Regional block*   Induction: Intravenous  PONV Risk Score and Plan: 3 and Ondansetron , Dexamethasone , Midazolam  and Treatment may vary due to age or medical condition  Airway Management Planned: Mask and LMA  Additional Equipment: None  Intra-op Plan:   Post-operative Plan: Extubation in OR  Informed Consent: I have reviewed the patients History and Physical, chart, labs and discussed the procedure including the risks, benefits and alternatives for the proposed anesthesia with the patient or authorized  representative who has indicated his/her understanding and acceptance.  Dental advisory given  Plan Discussed with: CRNA  Anesthesia Plan Comments:        Anesthesia Quick Evaluation

## 2023-10-26 NOTE — Anesthesia Postprocedure Evaluation (Signed)
 Anesthesia Post Note  Patient: Kayla Jacobs  Procedure(s) Performed: REPAIR, TENDON, PERONEUS BREVIS (Right: Ankle)     Patient location during evaluation: PACU Anesthesia Type: Regional and General Level of consciousness: awake and alert Pain management: pain level controlled Vital Signs Assessment: post-procedure vital signs reviewed and stable Respiratory status: spontaneous breathing, nonlabored ventilation, respiratory function stable and patient connected to nasal cannula oxygen Cardiovascular status: blood pressure returned to baseline and stable Postop Assessment: no apparent nausea or vomiting Anesthetic complications: no   No notable events documented.  Last Vitals:  Vitals:   10/26/23 0905 10/26/23 0910  BP:  (!) 147/81  Pulse: 70 80  Resp: 14   Temp:  (!) 36.3 C  SpO2: 100% 99%    Last Pain:  Vitals:   10/26/23 0910  TempSrc: Temporal  PainSc: 2                  Valente Gaskin Aroura Vasudevan

## 2023-10-26 NOTE — Progress Notes (Signed)
Assisted Dr. Jana Half with right, popliteal, ultrasound guided block. Side rails up, monitors on throughout procedure. See vital signs in flow sheet. Tolerated Procedure well.

## 2023-10-26 NOTE — Anesthesia Procedure Notes (Signed)
 Anesthesia Regional Block: Popliteal block   Pre-Anesthetic Checklist: , timeout performed,  Correct Patient, Correct Site, Correct Laterality,  Correct Procedure, Correct Position, site marked,  Risks and benefits discussed,  Surgical consent,  Pre-op  evaluation,  At surgeon's request and post-op pain management  Laterality: Right  Prep: Dura Prep       Needles:  Injection technique: Single-shot  Needle Type: Echogenic Stimulator Needle     Needle Length: 10cm  Needle Gauge: 20     Additional Needles:   Procedures:,,,, ultrasound used (permanent image in chart),,    Narrative:  Start time: 10/26/2023 7:10 AM End time: 10/26/2023 7:15 AM Injection made incrementally with aspirations every 5 mL.  Performed by: Personally  Anesthesiologist: Micheal Agent, DO  Additional Notes: Patient identified. Risks/Benefits/Options discussed with patient including but not limited to bleeding, infection, nerve damage, failed block, incomplete pain control. Patient expressed understanding and wished to proceed. All questions were answered. Sterile technique was used throughout the entire procedure. Please see nursing notes for vital signs. Aspirated in 5cc intervals with injection for negative confirmation. Patient was given instructions on fall risk and not to get out of bed. All questions and concerns addressed with instructions to call with any issues or inadequate analgesia.

## 2023-10-26 NOTE — Transfer of Care (Signed)
 Immediate Anesthesia Transfer of Care Note  Patient: Jimi G Tantillo  Procedure(s) Performed: REPAIR, TENDON, PERONEUS BREVIS (Right: Ankle)  Patient Location: PACU  Anesthesia Type:GA combined with regional for post-op pain  Level of Consciousness: drowsy and patient cooperative  Airway & Oxygen Therapy: Patient Spontanous Breathing and Patient connected to face mask oxygen  Post-op Assessment: Report given to RN and Post -op Vital signs reviewed and stable  Post vital signs: Reviewed and stable  Last Vitals:  Vitals Value Taken Time  BP 141/81 10/26/23 0900  Temp 36.1 C 10/26/23 0854  Pulse 71 10/26/23 0902  Resp 15 10/26/23 0902  SpO2 100 % 10/26/23 0902  Vitals shown include unfiled device data.  Last Pain:  Vitals:   10/26/23 0854  TempSrc:   PainSc: 0-No pain      Patients Stated Pain Goal: 5 (10/26/23 8295)  Complications: No notable events documented.

## 2023-10-27 ENCOUNTER — Encounter (HOSPITAL_BASED_OUTPATIENT_CLINIC_OR_DEPARTMENT_OTHER): Payer: Self-pay | Admitting: Orthopaedic Surgery

## 2024-01-19 ENCOUNTER — Other Ambulatory Visit: Payer: Self-pay

## 2024-01-19 MED ORDER — FLECAINIDE ACETATE 50 MG PO TABS: 50.0000 mg | ORAL_TABLET | Freq: Two times a day (BID) | ORAL | 0 refills | Status: DC

## 2024-02-29 ENCOUNTER — Ambulatory Visit: Admitting: Internal Medicine

## 2024-03-29 ENCOUNTER — Ambulatory Visit: Admitting: Pulmonary Disease

## 2024-04-05 NOTE — Progress Notes (Deleted)
  Electrophysiology Office Note:   Date:  04/05/2024  ID:  Shakeena G Strange, DOB May 04, 1956, MRN [id number]  Primary Cardiologist: None Electrophysiologist: Danelle Birmingham, MD   Electrophysiologist:  Danelle Birmingham, MD  {Click to update primary MD,subspecialty MD or APP then REFRESH:1}    History of Present Illness:   TAMMERA ENGERT is a 68 y.o. female with h/o Atrial flutter s/p CTI x 2 and Paroxysmal AF seen today for routine electrophysiology followup.   Since last being seen in our clinic the patient reports doing ***.  she denies chest pain, palpitations, dyspnea, PND, orthopnea, nausea, vomiting, dizziness, syncope, edema, weight gain, or early satiety.   Review of systems complete and found to be negative unless listed in HPI.   EP Information / Studies Reviewed:    EKG is ordered today. Personal review as below.       Arrhythmia/Device History No specialty comments available.   Physical Exam:   VS:  There were no vitals taken for this visit.   Wt Readings from Last 3 Encounters:  10/26/23 105 lb 9.6 oz (47.9 kg)  01/04/23 110 lb (49.9 kg)  06/03/22 110 lb (49.9 kg)     GEN: No acute distress NECK: No JVD; No carotid bruits CARDIAC: {EPRHYTHM:28826}, no murmurs, rubs, gallops RESPIRATORY:  Clear to auscultation without rales, wheezing or rhonchi  ABDOMEN: Soft, non-tender, non-distended EXTREMITIES:  {EDEMA LEVEL:28147::No} edema; No deformity   ASSESSMENT AND PLAN:    Atrial Flutter Atrial fib (Noted only during AFL ablation) S/p CTI 04/2019 and 04/2022 EKG today shows *** Continue flecainide  50 mg BID CHA2DS2/VASc is 2, including age and female. ***      {Click here to Review PMH, Prob List, Meds, Allergies, SHx, FHx  :1}   Follow up with {EPMDS:28135::EP Team} {EPFOLLOW UP:28173}  Signed, Ozell Prentice Passey, PA-C

## 2024-04-06 ENCOUNTER — Ambulatory Visit: Admitting: Student

## 2024-04-06 DIAGNOSIS — I484 Atypical atrial flutter: Secondary | ICD-10-CM

## 2024-04-06 DIAGNOSIS — I480 Paroxysmal atrial fibrillation: Secondary | ICD-10-CM

## 2024-04-17 ENCOUNTER — Other Ambulatory Visit: Payer: Self-pay | Admitting: Internal Medicine

## 2024-05-03 ENCOUNTER — Encounter: Payer: Self-pay | Admitting: Internal Medicine

## 2024-05-03 ENCOUNTER — Ambulatory Visit: Attending: Internal Medicine | Admitting: Internal Medicine

## 2024-05-03 VITALS — BP 114/68 | HR 62 | Ht 65.0 in | Wt 107.9 lb

## 2024-05-03 DIAGNOSIS — I480 Paroxysmal atrial fibrillation: Secondary | ICD-10-CM | POA: Diagnosis not present

## 2024-05-03 NOTE — Patient Instructions (Signed)
 Medication Instructions:  Your physician recommends that you continue on your current medications as directed. Please refer to the Current Medication list given to you today.  *If you need a refill on your cardiac medications before your next appointment, please call your pharmacy*  Lab Work: None ordered.  You may go to any [facility] Location for your lab work:  [facility] - [address] ([facility]) - [address] [phone] [address]   - [address]   [place]  - [address]   [place] - [address] - [address] [employer]  - [address] ([facility]   If you have labs (blood work) drawn today and your tests are completely normal, you will receive your results only by: [employer] (if you have MyChart)  If you have any lab test that is abnormal or we need to change your treatment, we will call you or send a MyChart message to review the results.  Testing/Procedures: None ordered.  Follow-Up: At [facility], you and your health needs are our priority.  As part of our continuing mission to provide you with exceptional heart care, we have created designated Provider Care Teams.  These Care Teams include your primary Cardiologist (physician) and Advanced Practice Providers (APPs -  Physician Assistants and Nurse Practitioners) who all work together to provide you with the care you need, when you need it.  We recommend signing up for the patient portal called "MyChart".  Sign up information is provided on this After Visit Summary.  MyChart is used to connect with patients for Virtual Visits (Telemedicine).  Patients are able to view lab/test results, encounter notes, upcoming appointments, etc.  Non-urgent messages can be sent to your provider as well.   To learn more about what you can do with MyChart, go to  [email or web address].    Your next appointment:   As needed

## 2024-05-03 NOTE — Progress Notes (Signed)
 HPI Kayla Jacobs returns today for followup. She is a pleasant 68 yo woman with recurrent atrial flutter who underwent catheter ablation a couple of years ago where during her procedure she had incessant atrial fib despite never having atrial fib and then had a late recurrence of a slower atrial flutter that turned out to be isthmus dependent flutter with 2:1 AV conduction. She underwent repeat ablation with CARTO and anesthesia and her flutter terminated with ablation the she had again had incessant atrial fib making her assessment of block in the flutter isthmus more difficult. We shocked her and she immediately returned to fib. She feels better and has had minimal palpitations. No syncope or chest pain. We stopped her eliquis  several months ago. She has not had palpitations or high heart rates. She feels well.  Allergies  Allergen Reactions   Macrobid [Nitrofurantoin] Swelling    Burning sensation and swelling     Current Outpatient Medications  Medication Sig Dispense Refill   Calcium Carbonate-Vitamin D (CALCIUM-D PO) Take 1 tablet by mouth daily.     flecainide  (TAMBOCOR ) 50 MG tablet TAKE 1 TABLET BY MOUTH TWICE A DAY 60 tablet 0   levothyroxine  (SYNTHROID ) 25 MCG tablet Take 25 mcg by mouth every morning.     Multiple Vitamin (MULTIVITAMIN WITH MINERALS) TABS tablet Take 1 tablet by mouth daily.     No current facility-administered medications for this visit.     Past Medical History:  Diagnosis Date   Cancer (HCC)    basal cell skin cancer   Dysrhythmia    Osteopenia     ROS:   All systems reviewed and negative except as noted in the HPI.   Past Surgical History:  Procedure Laterality Date   A-FLUTTER ABLATION N/A 04/06/2019   Procedure: A-FLUTTER ABLATION;  Surgeon: Waddell Danelle ORN, MD;  [facility] INVASIVE CV LAB;  Service: Cardiovascular;  Laterality: N/A;   A-FLUTTER ABLATION N/A 04/30/2022   Procedure: A-FLUTTER ABLATION;  Surgeon: Waddell Danelle ORN, MD;   Location: [facility] INVASIVE CV LAB;  Service: Cardiovascular;  Laterality: N/A;   CESAREAN SECTION  1988   COLONOSCOPY     LUMBAR DISC SURGERY  2005   POLYPECTOMY     REPAIR OF PERONEUS BREVIS TENDON Right 10/26/2023   Procedure: REPAIR, TENDON, PERONEUS BREVIS;  Surgeon: Barton Drape, MD;  Location: [facility];  Service: Orthopedics;  Laterality: Right;  preop block   TONSILLECTOMY  1962     Family History  Problem Relation Age of Onset   Esophageal cancer Father    Colon cancer Neg Hx    Stomach cancer Neg Hx    Colon polyps Neg Hx    Rectal cancer Neg Hx      Social History   Socioeconomic History   Marital status: Married    Spouse name: Not on file   Number of children: Not on file   Years of education: Not on file   Highest education level: Not on file  Occupational History   Not on file  Tobacco Use   Smoking status: Never   Smokeless tobacco: Never  Vaping Use   Vaping status: Never Used  Substance and Sexual Activity   Alcohol use: Yes    Alcohol/week: 7.0 standard drinks of alcohol    Types: 7 Glasses of wine per week    Comment: rare   Drug use: No   Sexual activity: Not on file  Other Topics Concern   Not  on file  Social History Narrative   Not on file   Social Drivers of Health   Financial Resource Strain: Not on file  Food Insecurity: Not on file  Transportation Needs: Not on file  Physical Activity: Not on file  Stress: Not on file  Social Connections: Not on file  Intimate Partner Violence: Not on file     BP 114/68   Pulse 62   Ht 5' 5 (1.651 m)   Wt 107 lb 14.4 oz (48.9 kg)   SpO2 99%   BMI 17.96 kg/m   Physical Exam:  Well appearing NAD HEENT: Unremarkable Neck:  No JVD, no thyromegally Lymphatics:  No adenopathy Back:  No CVA tenderness Lungs:  Clear with no wheezes HEART:  Regular rate rhythm, no murmurs, no rubs, no clicks Abd:  soft, positive bowel sounds, no organomegally, no rebound, no guarding Ext:   2 plus pulses, no edema, no cyanosis, no clubbing Skin:  No rashes no nodules Neuro:  CN II through XII intact, motor grossly intact  EKG - NSR  Assess/Plan:  Atrial flutter - her symptoms are controlled and hopefully it will not return. Her case limited by incessant atrial fib after RFA back to NSR.  Atrial fib - she has only had this during RF ablation. She has stopped her eliquis .  she will undergo watchful waiting. I asked her to call if she feels more palpitations.    Danelle Kristen Fromm,MD
